# Patient Record
Sex: Female | Born: 1980 | Race: Black or African American | Hispanic: No | Smoking: Former smoker
Health system: Southern US, Community
[De-identification: ages and names within clinical notes are randomized; demographics above are authoritative.]

## PROBLEM LIST (undated history)

## (undated) ENCOUNTER — Inpatient Hospital Stay (HOSPITAL_COMMUNITY): Payer: Self-pay

## (undated) DIAGNOSIS — K219 Gastro-esophageal reflux disease without esophagitis: Secondary | ICD-10-CM

## (undated) DIAGNOSIS — F32A Depression, unspecified: Secondary | ICD-10-CM

## (undated) DIAGNOSIS — O24419 Gestational diabetes mellitus in pregnancy, unspecified control: Secondary | ICD-10-CM

## (undated) DIAGNOSIS — F329 Major depressive disorder, single episode, unspecified: Secondary | ICD-10-CM

## (undated) DIAGNOSIS — E119 Type 2 diabetes mellitus without complications: Secondary | ICD-10-CM

## (undated) DIAGNOSIS — F419 Anxiety disorder, unspecified: Secondary | ICD-10-CM

## (undated) HISTORY — PX: CHOLECYSTECTOMY: SHX55

## (undated) HISTORY — PX: TONSILLECTOMY: SUR1361

## (undated) HISTORY — PX: WISDOM TOOTH EXTRACTION: SHX21

## (undated) HISTORY — PX: DILATION AND CURETTAGE OF UTERUS: SHX78

## (undated) HISTORY — DX: Type 2 diabetes mellitus without complications: E11.9

---
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
## 1999-02-13 ENCOUNTER — Encounter: Admission: RE | Admit: 1999-02-13 | Discharge: 1999-02-13 | Payer: Self-pay | Admitting: Sports Medicine

## 1999-02-23 ENCOUNTER — Emergency Department (HOSPITAL_COMMUNITY): Admission: EM | Admit: 1999-02-23 | Discharge: 1999-02-23 | Payer: Self-pay | Admitting: Emergency Medicine

## 1999-09-28 ENCOUNTER — Emergency Department (HOSPITAL_COMMUNITY): Admission: EM | Admit: 1999-09-28 | Discharge: 1999-09-28 | Payer: Self-pay | Admitting: Emergency Medicine

## 2001-04-20 ENCOUNTER — Ambulatory Visit: Admit: 2001-04-20 | Disposition: A | Discharge status: Home / Self Care | Payer: Self-pay | Source: Ambulatory Visit

## 2001-10-09 ENCOUNTER — Emergency Department: Admit: 2001-10-09 | Payer: Self-pay | Source: Emergency Department | Admitting: Emergency Medicine

## 2003-10-25 ENCOUNTER — Emergency Department: Admit: 2003-10-25 | Payer: Self-pay | Source: Emergency Department | Admitting: Emergency Medical Services

## 2003-10-27 ENCOUNTER — Emergency Department: Admit: 2003-10-27 | Payer: Self-pay | Source: Emergency Department | Admitting: Emergency Medicine

## 2005-06-23 ENCOUNTER — Other Ambulatory Visit: Admission: RE | Admit: 2005-06-23 | Discharge: 2005-06-23 | Payer: Self-pay | Admitting: Obstetrics and Gynecology

## 2005-10-06 ENCOUNTER — Emergency Department (HOSPITAL_COMMUNITY): Admission: EM | Admit: 2005-10-06 | Discharge: 2005-10-06 | Payer: Self-pay | Admitting: Family Medicine

## 2005-10-18 ENCOUNTER — Inpatient Hospital Stay (HOSPITAL_COMMUNITY): Admission: AD | Admit: 2005-10-18 | Discharge: 2005-10-18 | Payer: Self-pay | Admitting: Obstetrics and Gynecology

## 2006-01-11 ENCOUNTER — Inpatient Hospital Stay (HOSPITAL_COMMUNITY): Admission: AD | Admit: 2006-01-11 | Discharge: 2006-01-14 | Payer: Self-pay | Admitting: Obstetrics and Gynecology

## 2006-01-15 ENCOUNTER — Encounter: Admission: RE | Admit: 2006-01-15 | Discharge: 2006-01-20 | Payer: Self-pay | Admitting: Obstetrics and Gynecology

## 2006-07-18 ENCOUNTER — Emergency Department (HOSPITAL_COMMUNITY): Admission: EM | Admit: 2006-07-18 | Discharge: 2006-07-18 | Payer: Self-pay | Admitting: Emergency Medicine

## 2006-09-19 ENCOUNTER — Emergency Department (HOSPITAL_COMMUNITY): Admission: EM | Admit: 2006-09-19 | Discharge: 2006-09-19 | Payer: Self-pay | Admitting: Emergency Medicine

## 2006-12-03 ENCOUNTER — Emergency Department (HOSPITAL_COMMUNITY): Admission: EM | Admit: 2006-12-03 | Discharge: 2006-12-03 | Payer: Self-pay | Admitting: Emergency Medicine

## 2006-12-08 ENCOUNTER — Encounter: Admission: RE | Admit: 2006-12-08 | Discharge: 2006-12-08 | Payer: Self-pay | Admitting: Internal Medicine

## 2007-01-17 ENCOUNTER — Ambulatory Visit (HOSPITAL_COMMUNITY): Admission: RE | Admit: 2007-01-17 | Discharge: 2007-01-17 | Payer: Self-pay | Admitting: Surgery

## 2007-01-17 ENCOUNTER — Encounter (INDEPENDENT_AMBULATORY_CARE_PROVIDER_SITE_OTHER): Payer: Self-pay | Admitting: Surgery

## 2007-07-03 ENCOUNTER — Emergency Department (HOSPITAL_COMMUNITY): Admission: EM | Admit: 2007-07-03 | Discharge: 2007-07-03 | Payer: Self-pay | Admitting: Emergency Medicine

## 2007-07-19 ENCOUNTER — Emergency Department (HOSPITAL_COMMUNITY): Admission: EM | Admit: 2007-07-19 | Discharge: 2007-07-19 | Payer: Self-pay | Admitting: Emergency Medicine

## 2008-04-15 ENCOUNTER — Emergency Department (HOSPITAL_COMMUNITY): Admission: EM | Admit: 2008-04-15 | Discharge: 2008-04-15 | Payer: Self-pay | Admitting: Emergency Medicine

## 2008-05-29 ENCOUNTER — Emergency Department (HOSPITAL_COMMUNITY): Admission: EM | Admit: 2008-05-29 | Discharge: 2008-05-29 | Payer: Self-pay | Admitting: *Deleted

## 2008-08-04 ENCOUNTER — Emergency Department (HOSPITAL_COMMUNITY): Admission: EM | Admit: 2008-08-04 | Discharge: 2008-08-04 | Payer: Self-pay | Admitting: Emergency Medicine

## 2008-09-21 IMAGING — CT CT ABDOMEN W/O CM
2 of 4 series · 17 of 46 positions shown, 19 images · IV contrast (agent unspecified)
Comparison: None.

CLINICAL DATA: Left flank pain and nausea.
 ABDOMEN CT WITHOUT CONTRAST:
TECHNIQUE: Multidetector CT imaging of the abdomen was performed following the standard protocol without IV contrast.
TECHNIQUE: Multidetector CT imaging of the pelvis was performed following the standard protocol without IV contrast.

[Series 2: greater than (id) · axial · 0.94mm/px · z∈[-536,-86]mm · 14 of 100 slices shown, 16 images]
[im 5/100  soft-tissue]
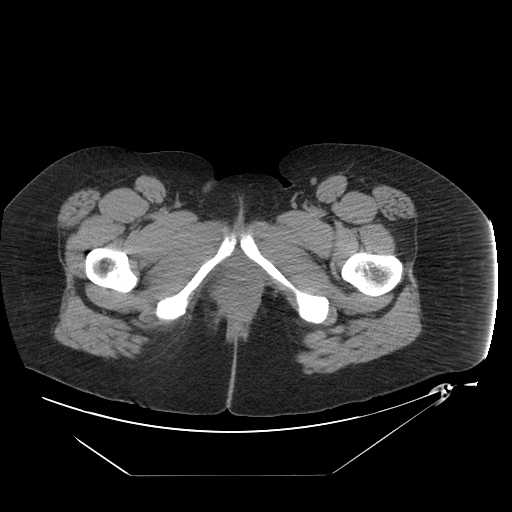
[im 5/100  bone]
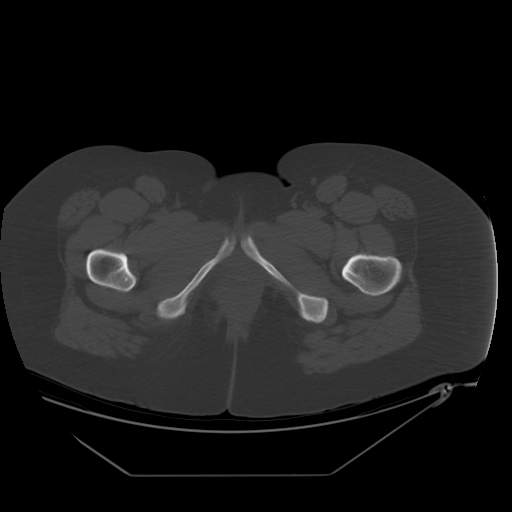
[im 13/100  soft-tissue]
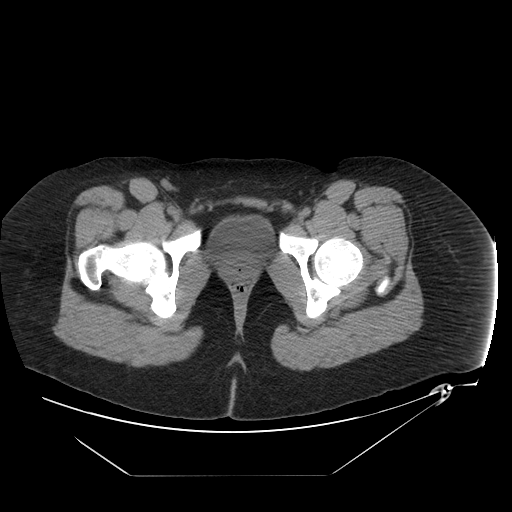
[im 21/100  soft-tissue]
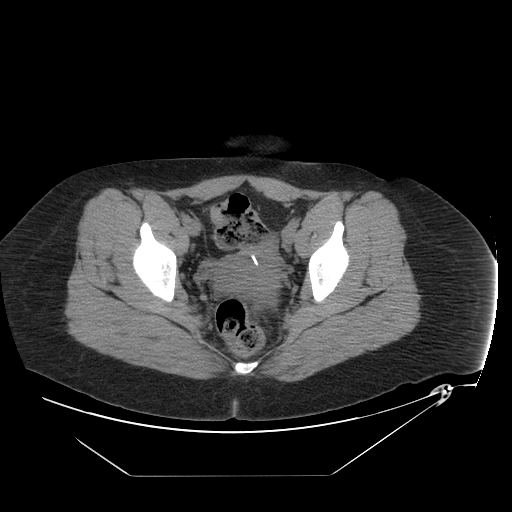
[im 25/100  soft-tissue]
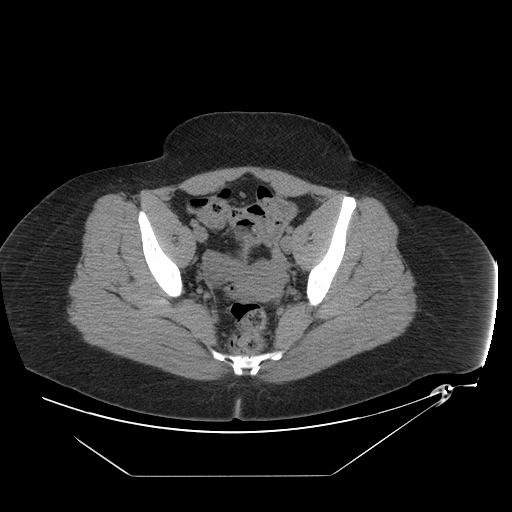
[im 34/100  soft-tissue]
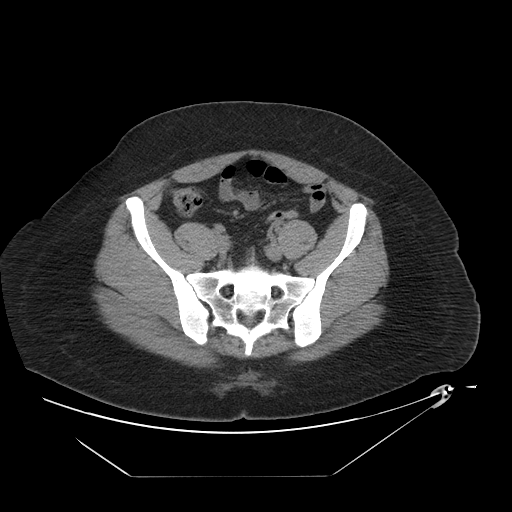
[im 42/100  soft-tissue]
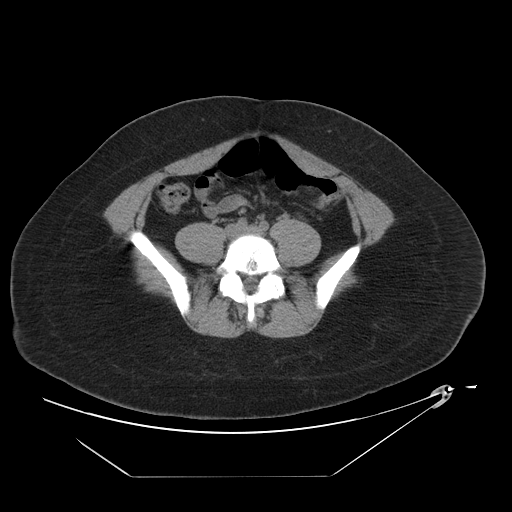
[im 46/100  soft-tissue]
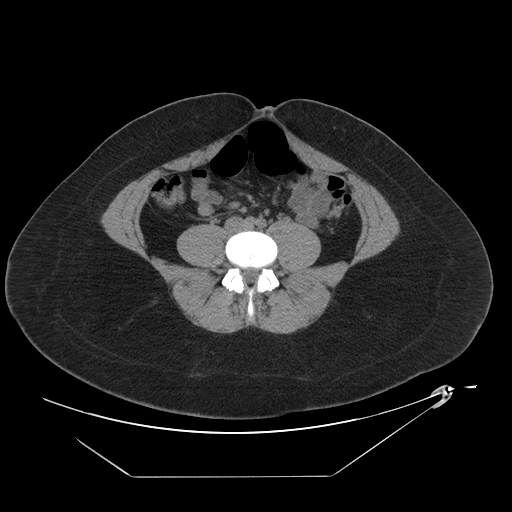
[im 54/100  soft-tissue]
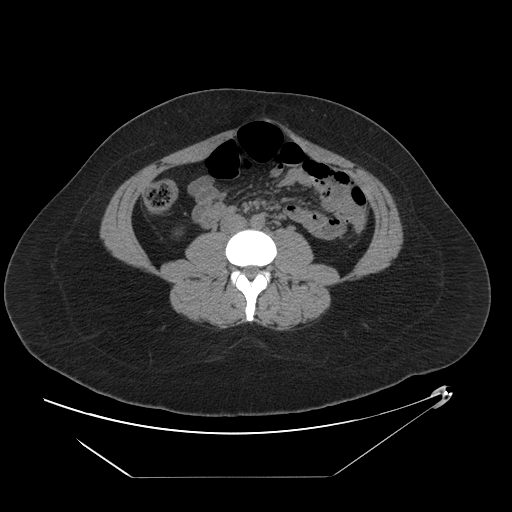
[im 58/100  soft-tissue]
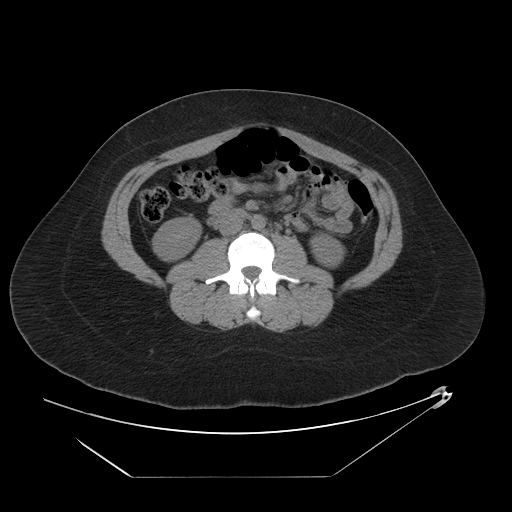
[im 58/100  bone]
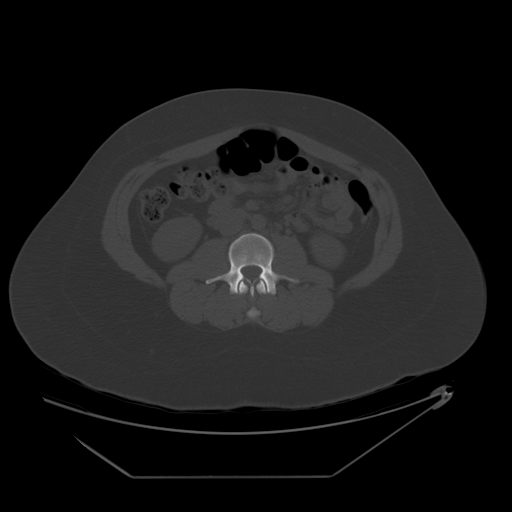
[im 67/100  soft-tissue]
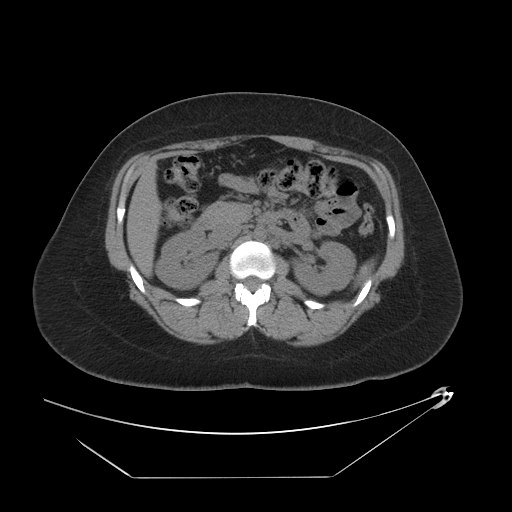
[im 75/100  soft-tissue]
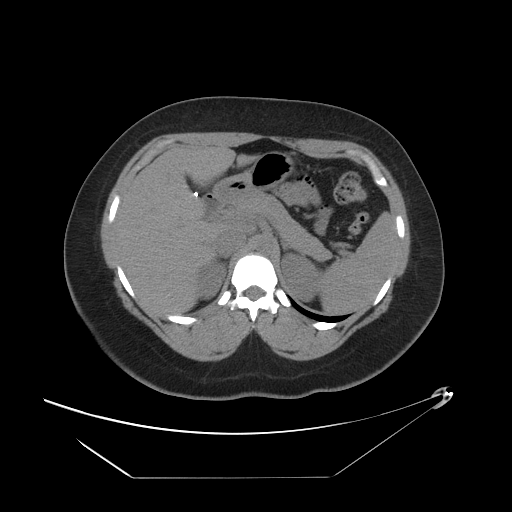
[im 79/100  soft-tissue]
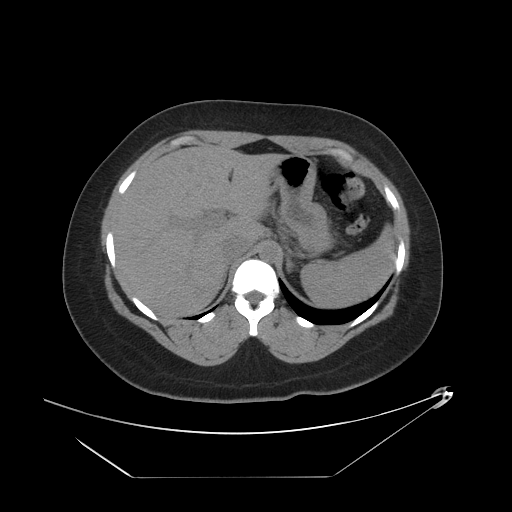
[im 87/100  soft-tissue]
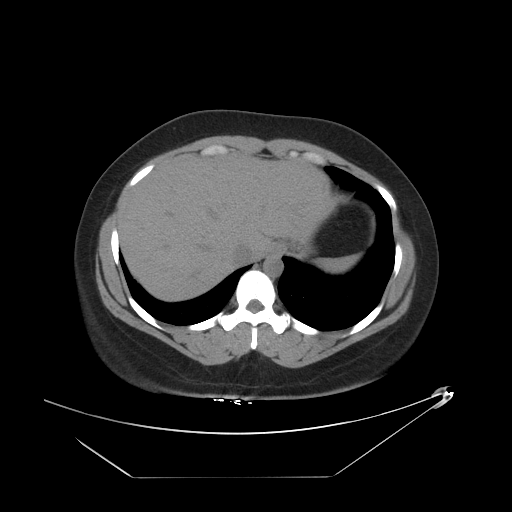
[im 95/100  soft-tissue]
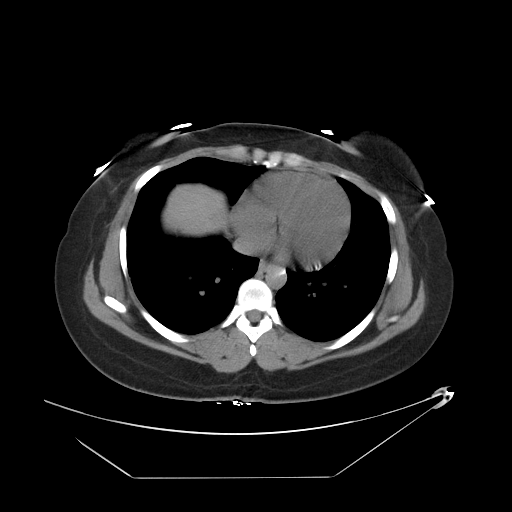

[Series 401: reformatted · coronal · 0.99mm/px · 3 of 134 slices shown]
[im 45/134  soft-tissue]
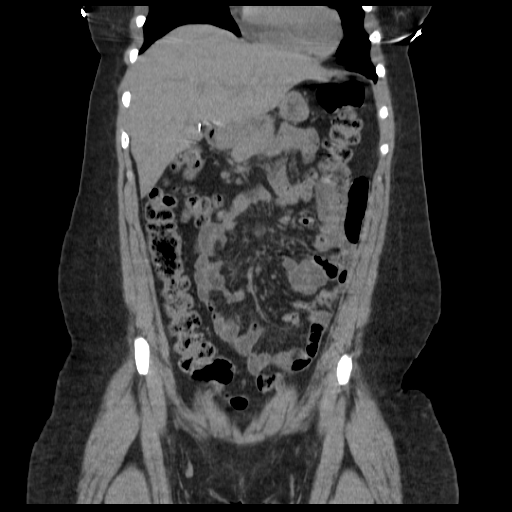
[im 60/134  soft-tissue]
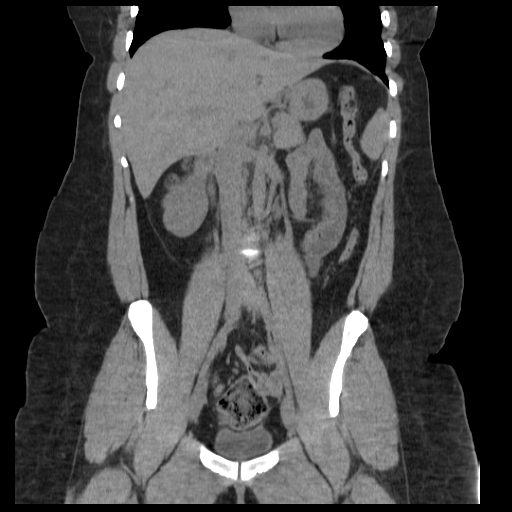
[im 74/134  soft-tissue]
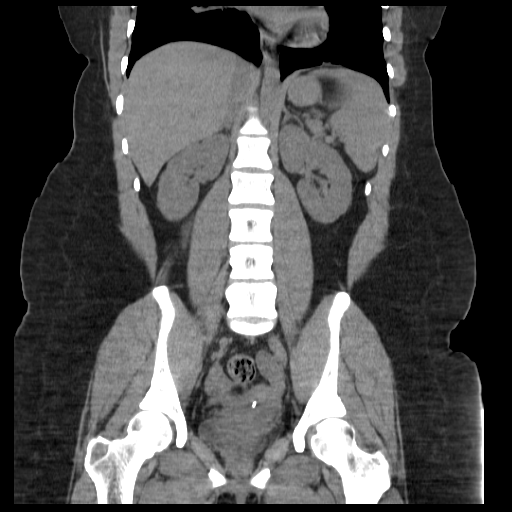

[17 of 46 positions shown; findings below may reference images not displayed]

FINDINGS: Right base pulmonary nodule measures 4 mm.
 Subpleural nodule at the left base measures 5.5 mm. 
 There is no pleural fluid or pulmonary edema. 
 The liver is normal in attenuation and morphology.  
 The patient is status post cholecystectomy. 
 The spleen is negative.  
 Both adrenal glands are negative.
 The right kidney is negative.  
 The left kidney is negative.  
 No evidence for nephrolithiasis or hydronephrosis is noted.
 No evidence for proximal hydroureter.
 Negative for enlarged retroperitoneal or small bowel mesenteric lymph nodes.  
 There is no free fluid or abnormal fluid collections.  
 Bowel loops of the upper abdomen are unremarkable.
 The patient is status post cholecystectomy. 
 Review of the bone windows is grossly unremarkable.
IMPRESSION: 1.  Negative for renal stone or obstructive uropathy.
 2.  Status post cholecystectomy.  
 PELVIS CT WITHOUT CONTRAST:
FINDINGS: There is no evidence for distal ureteral urolithiasis or hydroureter.  Urinary bladder is negative.  
 There is an IUD within the uterine cavity.
 The adnexal structures appear unremarkable.  There is a small amount of free fluid in the dependent portion of the pelvis.  
 There are no enlarged pelvic or inguinal lymph nodes. 
 Review of bone windows is unremarkable.
IMPRESSION: 1.  Negative for bladder stone or distal ureterolithiasis.
 2.  IUD in uterine cavity.
 3.  Trace free pelvic fluid.

## 2008-10-16 ENCOUNTER — Emergency Department (HOSPITAL_COMMUNITY): Admission: EM | Admit: 2008-10-16 | Discharge: 2008-10-16 | Payer: Self-pay | Admitting: Family Medicine

## 2009-02-11 ENCOUNTER — Emergency Department (HOSPITAL_COMMUNITY): Admission: EM | Admit: 2009-02-11 | Discharge: 2009-02-11 | Payer: Self-pay | Admitting: Family Medicine

## 2009-07-07 ENCOUNTER — Emergency Department (HOSPITAL_COMMUNITY): Admission: EM | Admit: 2009-07-07 | Discharge: 2009-07-07 | Payer: Self-pay | Admitting: Family Medicine

## 2009-09-11 ENCOUNTER — Emergency Department (HOSPITAL_COMMUNITY): Admission: EM | Admit: 2009-09-11 | Discharge: 2009-09-11 | Payer: Self-pay | Admitting: Emergency Medicine

## 2009-10-03 ENCOUNTER — Encounter (INDEPENDENT_AMBULATORY_CARE_PROVIDER_SITE_OTHER): Payer: Self-pay | Admitting: Cardiovascular Disease

## 2009-10-03 ENCOUNTER — Ambulatory Visit (HOSPITAL_COMMUNITY): Admission: RE | Admit: 2009-10-03 | Discharge: 2009-10-03 | Payer: Self-pay | Admitting: Cardiovascular Disease

## 2010-07-27 ENCOUNTER — Inpatient Hospital Stay (INDEPENDENT_AMBULATORY_CARE_PROVIDER_SITE_OTHER)
Admission: RE | Admit: 2010-07-27 | Discharge: 2010-07-27 | Disposition: A | Discharge status: Home / Self Care | Payer: 59 | Source: Ambulatory Visit | Attending: Emergency Medicine | Admitting: Emergency Medicine

## 2010-07-27 DIAGNOSIS — J019 Acute sinusitis, unspecified: Secondary | ICD-10-CM

## 2010-08-20 LAB — CBC
HCT: 34.2 % — ABNORMAL LOW (ref 36.0–46.0)
Hemoglobin: 11.8 g/dL — ABNORMAL LOW (ref 12.0–15.0)
MCHC: 34.5 g/dL (ref 30.0–36.0)
MCV: 87.7 fL (ref 78.0–100.0)
Platelets: 306 10*3/uL (ref 150–400)
RBC: 3.91 MIL/uL (ref 3.87–5.11)
RDW: 13.1 % (ref 11.5–15.5)
WBC: 8.2 10*3/uL (ref 4.0–10.5)

## 2010-08-20 LAB — COMPREHENSIVE METABOLIC PANEL
AST: 20 U/L (ref 0–37)
Albumin: 3.4 g/dL — ABNORMAL LOW (ref 3.5–5.2)
Alkaline Phosphatase: 45 U/L (ref 39–117)
BUN: 7 mg/dL (ref 6–23)
Chloride: 106 mEq/L (ref 96–112)
Creatinine, Ser: 0.76 mg/dL (ref 0.4–1.2)
Glucose, Bld: 118 mg/dL — ABNORMAL HIGH (ref 70–99)
Total Bilirubin: 0.4 mg/dL (ref 0.3–1.2)

## 2010-08-20 LAB — DIFFERENTIAL
Eosinophils Absolute: 0.1 10*3/uL (ref 0.0–0.7)
Eosinophils Relative: 1 % (ref 0–5)
Lymphs Abs: 2.9 10*3/uL (ref 0.7–4.0)
Monocytes Absolute: 0.7 10*3/uL (ref 0.1–1.0)
Neutro Abs: 4.4 10*3/uL (ref 1.7–7.7)
Neutrophils Relative %: 53 % (ref 43–77)

## 2010-08-20 LAB — PROTIME-INR: Prothrombin Time: 12.9 seconds (ref 11.6–15.2)

## 2010-08-20 LAB — CK TOTAL AND CKMB (NOT AT ARMC)
CK, MB: 1 ng/mL (ref 0.3–4.0)
Relative Index: 0.8 (ref 0.0–2.5)

## 2010-08-20 LAB — POCT CARDIAC MARKERS
CKMB, poc: 1.3 ng/mL (ref 1.0–8.0)
CKMB, poc: <1 ng/mL — ABNORMAL LOW (ref 1.0–8.0)
Myoglobin, poc: 62.7 ng/mL (ref 12–200)
Troponin i, poc: <0.05 ng/mL (ref 0.00–0.09)

## 2010-08-20 LAB — POCT I-STAT, CHEM 8
BUN: 8 mg/dL (ref 6–23)
Calcium, Ion: 1.21 mmol/L (ref 1.12–1.32)
Chloride: 104 mEq/L (ref 96–112)
Creatinine, Ser: 0.7 mg/dL (ref 0.4–1.2)
Glucose, Bld: 151 mg/dL — ABNORMAL HIGH (ref 70–99)
HCT: 36 % (ref 36.0–46.0)
Hemoglobin: 12.2 g/dL (ref 12.0–15.0)
Potassium: 3.8 mEq/L (ref 3.5–5.1)
Sodium: 141 mEq/L (ref 135–145)
TCO2: 26 mmol/L (ref 0–100)

## 2010-08-20 LAB — HEMOGLOBIN A1C
Hgb A1c MFr Bld: 6.2 % — ABNORMAL HIGH (ref <5.7)
Mean Plasma Glucose: 131 mg/dL — ABNORMAL HIGH (ref <117)

## 2010-08-20 LAB — D-DIMER, QUANTITATIVE: D-Dimer, Quant: 0.47 ug/mL-FEU (ref 0.00–0.48)

## 2010-09-05 LAB — POCT PREGNANCY, URINE: Preg Test, Ur: NEGATIVE

## 2010-09-07 ENCOUNTER — Inpatient Hospital Stay (INDEPENDENT_AMBULATORY_CARE_PROVIDER_SITE_OTHER)
Admission: RE | Admit: 2010-09-07 | Discharge: 2010-09-07 | Disposition: A | Discharge status: Home / Self Care | Payer: 59 | Source: Ambulatory Visit | Attending: Emergency Medicine | Admitting: Emergency Medicine

## 2010-09-07 DIAGNOSIS — J309 Allergic rhinitis, unspecified: Secondary | ICD-10-CM

## 2010-09-07 DIAGNOSIS — J029 Acute pharyngitis, unspecified: Secondary | ICD-10-CM

## 2010-09-07 LAB — POCT RAPID STREP A (OFFICE): Streptococcus, Group A Screen (Direct): NEGATIVE

## 2010-10-14 NOTE — Op Note (Signed)
Rachael Chapman, Rachael Chapman            ACCOUNT NO.:  [id number]   MEDICAL RECORD NO.:  [id number]          PATIENT TYPE:  AMB   LOCATION:  SDS                          FACILITY:  [facility]   PHYSICIAN:  Thornton Park. Daphine Deutscher, MD  DATE OF BIRTH:  Mar 08, 1981   DATE OF PROCEDURE:  01/17/2007  DATE OF DISCHARGE:                               OPERATIVE REPORT   PREOPERATIVE DIAGNOSIS:  Chronic cholecystitis.   POSTOPERATIVE DIAGNOSIS:  Chronic cholecystitis with solitary this stone  and normal intraoperative cholangiogram.   PROCEDURE:  Laparoscopic cholecystectomy.   SURGEON:  Luretha Murphy, M.D.   ASSISTANT:  Harriette Bouillon, M.D.   ANESTHESIA:  General endotracheal.   DESCRIPTION OF PROCEDURE:  The patient was taken to room 17 on January 17, 2007 and given general anesthesia.  Latex precautions were used.  Abdomen was prepped with Techni-Care and draped sterilely.  Abdomen was  entered with a longitudinal incision to the umbilicus.  The Hasson  cannula was inserted without difficulty.  The abdomen was insufflated  and exposure was gained to the upper abdomen.  Three trocars placed in  upper abdomen, the gallbladder was grasped, elevated.  Calot's triangle  was dissected free and the anatomy was identified including Calot's node  and the cystic duct and these were clipped and then a Reddick catheter  was introduced and a cholangiogram obtained using the C-arm which showed  good intrahepatic filling of right and left ducts and free flow into the  duodenum.  No stones.  The cystic duct was triple clipped, divided and  the cystic artery seemed to come out over on the right side and more  lateral and this was double clipped, divided and the gallbladder was  removed from the gallbladder bed with the hook electrocautery without  entering it.  The gallbladder bed was inspected.  No bleeding or bile  leaks were noted.  The gallbladder was placed in a bag and brought out  through the umbilicus.   Under laparoscopic vision the umbilical defect  was repaired with 0 Vicryl figure-of-eight suture.  The gallbladder bed  was reinspected and trocar sites were inspected.  No other abnormalities  noted.  Abdomen was deflated.  Port sites had all been injected with  Marcaine, lidocaine and closed 4-0 Vicryl, Benzoin and Steri-Strips.  The patient tolerated procedure well and was taken to recovery room in  satisfactory condition.      Thornton Park Daphine Deutscher, MD  Electronically Signed     MBM/MEDQ  D:  01/17/2007  T:  01/17/2007  Job:  [id number]   cc:   Olene Craven, M.D.

## 2011-01-04 ENCOUNTER — Inpatient Hospital Stay (INDEPENDENT_AMBULATORY_CARE_PROVIDER_SITE_OTHER)
Admission: RE | Admit: 2011-01-04 | Discharge: 2011-01-04 | Disposition: A | Discharge status: Home / Self Care | Payer: 59 | Source: Ambulatory Visit | Attending: Family Medicine | Admitting: Family Medicine

## 2011-01-04 DIAGNOSIS — J019 Acute sinusitis, unspecified: Secondary | ICD-10-CM

## 2011-02-20 LAB — DIFFERENTIAL
Eosinophils Relative: 2
Lymphocytes Relative: 43
Lymphs Abs: 3.1
Monocytes Absolute: 0.6
Monocytes Relative: 9
Neutro Abs: 3.3

## 2011-02-20 LAB — URINE MICROSCOPIC-ADD ON

## 2011-02-20 LAB — I-STAT 8, (EC8 V) (CONVERTED LAB)
Acid-Base Excess: 3 — ABNORMAL HIGH
BUN: 4 — ABNORMAL LOW
Chloride: 105
Glucose, Bld: 85
Potassium: 4.3
pCO2, Ven: 45.4
pH, Ven: 7.399 — ABNORMAL HIGH

## 2011-02-20 LAB — CBC
HCT: 38.8
Hemoglobin: 12.9
RBC: 4.31
RDW: 12.3

## 2011-02-20 LAB — POCT I-STAT CREATININE
Creatinine, Ser: 1
Operator id: 285491

## 2011-02-20 LAB — URINALYSIS, ROUTINE W REFLEX MICROSCOPIC
Glucose, UA: NEGATIVE
Ketones, ur: NEGATIVE
Leukocytes, UA: NEGATIVE
Protein, ur: NEGATIVE
Specific Gravity, Urine: 1.005

## 2011-03-03 ENCOUNTER — Inpatient Hospital Stay (HOSPITAL_COMMUNITY)
Admission: AD | Admit: 2011-03-03 | Discharge: 2011-03-03 | Disposition: A | Discharge status: Home / Self Care | Payer: 59 | Source: Ambulatory Visit | Attending: Obstetrics and Gynecology | Admitting: Obstetrics and Gynecology

## 2011-03-03 ENCOUNTER — Encounter (HOSPITAL_COMMUNITY): Payer: Self-pay

## 2011-03-03 DIAGNOSIS — R1115 Cyclical vomiting syndrome unrelated to migraine: Secondary | ICD-10-CM

## 2011-03-03 DIAGNOSIS — R1110 Vomiting, unspecified: Secondary | ICD-10-CM

## 2011-03-03 DIAGNOSIS — O210 Mild hyperemesis gravidarum: Secondary | ICD-10-CM | POA: Insufficient documentation

## 2011-03-03 LAB — URINALYSIS, ROUTINE W REFLEX MICROSCOPIC
Nitrite: NEGATIVE
Protein, ur: NEGATIVE mg/dL
Specific Gravity, Urine: 1.01 (ref 1.005–1.030)
Urobilinogen, UA: 0.2 mg/dL (ref 0.0–1.0)

## 2011-03-03 LAB — URINE MICROSCOPIC-ADD ON

## 2011-03-03 MED ORDER — PROMETHAZINE HCL 25 MG PO TABS: 25.0000 mg | ORAL_TABLET | Freq: Four times a day (QID) | ORAL | Status: DC | PRN

## 2011-03-03 NOTE — Progress Notes (Signed)
Pt state she has had nausea and vomiting with the pregnancy and has been controlled with Zofran in the morning. States today is worse. Has a slight headache.

## 2011-03-03 NOTE — ED Notes (Signed)
States takes Zofran every morning and has been doing ok, but this morning was worse and she vomited X 1. States she feels better now, but is afraid she is going to feel worse again. States she has a slight HA and just does not feel well.

## 2011-03-03 NOTE — ED Provider Notes (Signed)
History   Pt presents today c/o N&V x 1 this am. She states she normally takes zofran but today it didn't seem to help. When she got to work she vomited and she became concerned, so she presented to the MAU. She denies abd pain, vag dc, bleeding, or any other problems at this time.  Chief Complaint  Patient presents with  . Emesis   HPI  OB History    Grav Para Term Preterm Abortions TAB SAB Ect Mult Living   3 1 1  0 1 0 0 1 0 1      Past Medical History  Diagnosis Date  . Asthma     Past Surgical History  Procedure Date  . Tonsillectomy   . Cholecystectomy   . Dilation and curettage of uterus   . Wisdom tooth extraction     No family history on file.  History  Substance Use Topics  . Smoking status: Former Games developer  . Smokeless tobacco: Never Used  . Alcohol Use: No    Allergies: Allergies not on file  No prescriptions prior to admission    Review of Systems  Constitutional: Negative for fever.  Cardiovascular: Negative for chest pain and palpitations.  Gastrointestinal: Positive for nausea and vomiting. Negative for abdominal pain, diarrhea and constipation.  Genitourinary: Negative for dysuria, urgency, frequency and hematuria.  Neurological: Negative for dizziness.  Psychiatric/Behavioral: Negative for depression and suicidal ideas.   Physical Exam   Blood pressure 126/80, pulse 85, temperature 98.9 F (37.2 C), temperature source Oral, resp. rate 20, height 5\' 9"  (1.753 m), weight 250 lb 9.6 oz (113.671 kg), last menstrual period 12/24/2010, SpO2 100.00%.  Physical Exam  Constitutional: She is oriented to person, place, and time. She appears well-developed and well-nourished. No distress.  HENT:  Head: Normocephalic and atraumatic.  Eyes: EOM are normal. Pupils are equal, round, and reactive to light.  Cardiovascular: Normal rate, regular rhythm and normal heart sounds.  Exam reveals no gallop and no friction rub.   No murmur heard. Respiratory: Effort  normal and breath sounds normal. No respiratory distress. She has no wheezes. She has no rales. She exhibits no tenderness.  GI: Soft. She exhibits no distension. There is no tenderness. There is no rebound and no guarding.  Neurological: She is alert and oriented to person, place, and time.  Skin: Skin is warm and dry. She is not diaphoretic.  Psychiatric: She has a normal mood and affect. Her behavior is normal. Judgment and thought content normal.    MAU Course  Procedures  Results for orders placed during the hospital encounter of 03/03/11 (from the past 24 hour(s))  URINALYSIS, ROUTINE W REFLEX MICROSCOPIC     Status: Abnormal   Collection Time   03/03/11  1:17 PM      Component Value Range   Color, Urine YELLOW  YELLOW    Appearance CLEAR  CLEAR    Specific Gravity, Urine 1.010  1.005 - 1.030    pH 6.5  5.0 - 8.0    Glucose, UA NEGATIVE  NEGATIVE (mg/dL)   Hgb urine dipstick TRACE (*) NEGATIVE    Bilirubin Urine NEGATIVE  NEGATIVE    Ketones, ur NEGATIVE  NEGATIVE (mg/dL)   Protein, ur NEGATIVE  NEGATIVE (mg/dL)   Urobilinogen, UA 0.2  0.0 - 1.0 (mg/dL)   Nitrite NEGATIVE  NEGATIVE    Leukocytes, UA TRACE (*) NEGATIVE   URINE MICROSCOPIC-ADD ON     Status: Abnormal   Collection Time   03/03/11  1:17 PM      Component Value Range   Squamous Epithelial / LPF FEW (*) RARE    WBC, UA 0-2  <3 (WBC/hpf)   RBC / HPF 0-2  <3 (RBC/hpf)    Discussed pt with Dr. Renaldo Fiddler. Assessment and Plan  N&V: discussed with pt at length. Will give Rx for phenergan. She has f/u scheduled. Discussed diet, activity, risks, and precautions.  Clinton Gallant. Rice III, DrHSc, MPAS, PA-C  03/03/2011, 1:35 PM   Henrietta Hoover, PA 03/03/11 1341

## 2011-03-06 LAB — OB RESULTS CONSOLE ABO/RH: RH Type: POSITIVE

## 2011-03-06 LAB — OB RESULTS CONSOLE HIV ANTIBODY (ROUTINE TESTING): HIV: NONREACTIVE

## 2011-03-16 LAB — CBC
HCT: 37.7
Hemoglobin: 12.8
MCHC: 34
Platelets: 393
RDW: 12.9

## 2011-03-16 LAB — BASIC METABOLIC PANEL
BUN: 5 — ABNORMAL LOW
CO2: 31
Calcium: 9.6
Glucose, Bld: 102 — ABNORMAL HIGH
Potassium: 3.8
Sodium: 141

## 2011-03-17 LAB — POCT PREGNANCY, URINE
Operator id: 28425
Preg Test, Ur: NEGATIVE

## 2011-03-17 LAB — URINALYSIS, ROUTINE W REFLEX MICROSCOPIC
Bilirubin Urine: NEGATIVE
Hgb urine dipstick: NEGATIVE
Nitrite: NEGATIVE
Protein, ur: NEGATIVE
Urobilinogen, UA: 1

## 2011-03-17 LAB — DIFFERENTIAL
Basophils Absolute: 0
Lymphocytes Relative: 7 — ABNORMAL LOW
Lymphs Abs: 0.5 — ABNORMAL LOW
Monocytes Absolute: 0.7
Monocytes Relative: 10
Neutro Abs: 6.4

## 2011-03-17 LAB — URINE CULTURE: Colony Count: >=100000

## 2011-03-17 LAB — POCT I-STAT CREATININE: Creatinine, Ser: 0.8

## 2011-03-17 LAB — CBC
Hemoglobin: 14
RBC: 4.73
WBC: 7.7

## 2011-03-17 LAB — I-STAT 8, (EC8 V) (CONVERTED LAB)
Acid-Base Excess: 2
Chloride: 104
pCO2, Ven: 31.9 — ABNORMAL LOW
pH, Ven: 7.496 — ABNORMAL HIGH

## 2011-06-27 ENCOUNTER — Encounter (HOSPITAL_COMMUNITY): Payer: Self-pay | Admitting: Advanced Practice Midwife

## 2011-06-27 ENCOUNTER — Inpatient Hospital Stay (HOSPITAL_COMMUNITY)
Admission: AD | Admit: 2011-06-27 | Discharge: 2011-06-27 | Disposition: A | Discharge status: Home / Self Care | Payer: 59 | Source: Ambulatory Visit | Attending: Obstetrics and Gynecology | Admitting: Obstetrics and Gynecology

## 2011-06-27 DIAGNOSIS — K529 Noninfective gastroenteritis and colitis, unspecified: Secondary | ICD-10-CM

## 2011-06-27 DIAGNOSIS — K5289 Other specified noninfective gastroenteritis and colitis: Secondary | ICD-10-CM

## 2011-06-27 DIAGNOSIS — O212 Late vomiting of pregnancy: Secondary | ICD-10-CM | POA: Insufficient documentation

## 2011-06-27 DIAGNOSIS — O368190 Decreased fetal movements, unspecified trimester, not applicable or unspecified: Secondary | ICD-10-CM | POA: Insufficient documentation

## 2011-06-27 DIAGNOSIS — R109 Unspecified abdominal pain: Secondary | ICD-10-CM | POA: Insufficient documentation

## 2011-06-27 LAB — URINE MICROSCOPIC-ADD ON

## 2011-06-27 LAB — URINALYSIS, ROUTINE W REFLEX MICROSCOPIC
Glucose, UA: NEGATIVE mg/dL
Ketones, ur: NEGATIVE mg/dL
pH: 6.5 (ref 5.0–8.0)

## 2011-06-27 LAB — COMPREHENSIVE METABOLIC PANEL
ALT: 8 U/L (ref 0–35)
AST: 13 U/L (ref 0–37)
Calcium: 9.2 mg/dL (ref 8.4–10.5)
Sodium: 136 mEq/L (ref 135–145)
Total Protein: 6.2 g/dL (ref 6.0–8.3)

## 2011-06-27 LAB — CBC
MCH: 28.8 pg (ref 26.0–34.0)
MCHC: 32.6 g/dL (ref 30.0–36.0)
Platelets: 332 10*3/uL (ref 150–400)
RDW: 12.8 % (ref 11.5–15.5)

## 2011-06-27 MED ORDER — LACTATED RINGERS IV SOLN: Freq: Once | INTRAVENOUS | Status: DC

## 2011-06-27 MED ORDER — LACTATED RINGERS IV SOLN
Freq: Once | INTRAVENOUS | Status: AC
  Administered 2011-06-27: 13:00:00 via INTRAVENOUS

## 2011-06-27 MED ORDER — ONDANSETRON HCL 8 MG PO TABS: 8.0000 mg | ORAL_TABLET | Freq: Three times a day (TID) | ORAL | Status: AC | PRN

## 2011-06-27 MED ORDER — ONDANSETRON HCL 4 MG/2ML IJ SOLN
4.0000 mg | Freq: Once | INTRAMUSCULAR | Status: AC
  Administered 2011-06-27: 4 mg via INTRAVENOUS
  Filled 2011-06-27: qty 2

## 2011-06-27 NOTE — Progress Notes (Signed)
Onset of vomiting this morning with cramping and decreased fetal movement, [redacted]w[redacted]d

## 2011-06-27 NOTE — ED Provider Notes (Signed)
History     Chief Complaint  Patient presents with  . Emesis  . Abdominal Cramping  . Decreased Fetal Movement   HPI This is a 31 y.o. female at [redacted]w[redacted]d who presents with C/O nausea and vomiting for several hours. Also has intermittent abdominal cramping with loose stools. No fever. Some decrease in Fetal movement.    OB History    Grav Para Term Preterm Abortions TAB SAB Ect Mult Living   3 1 1  0 1 0 0 1 0 1      Past Medical History  Diagnosis Date  . Asthma     Past Surgical History  Procedure Date  . Tonsillectomy   . Cholecystectomy   . Dilation and curettage of uterus   . Wisdom tooth extraction     History reviewed. No pertinent family history.  History  Substance Use Topics  . Smoking status: Former Games developer  . Smokeless tobacco: Never Used  . Alcohol Use: No    Allergies:  Allergies  Allergen Reactions  . Codeine Nausea And Vomiting    Prescriptions prior to admission  Medication Sig Dispense Refill  . Prenatal Vit-Fe Fumarate-FA (PRENATAL MULTIVITAMIN) TABS Take 1 tablet by mouth daily.      . promethazine (PHENERGAN) 25 MG tablet Take 25 mg by mouth every 6 (six) hours as needed. Takes for nausea        ROS As above  Physical Exam   Blood pressure 108/62, pulse 88, temperature 97.4 F (36.3 C), temperature source Oral, resp. rate 16, height 5\' 9"  (1.753 m), weight 260 lb (117.935 kg), last menstrual period 12/24/2010.  Physical Exam  Constitutional: She is oriented to person, place, and time. She appears well-developed and well-nourished. No distress.  HENT:  Head: Normocephalic.  Cardiovascular: Normal rate, regular rhythm and normal heart sounds.   Respiratory: Effort normal and breath sounds normal. No respiratory distress. She has no wheezes. She has no rales.  GI: Soft. She exhibits no distension and no mass. There is tenderness (mild, generalized). There is no rebound and no guarding.  Genitourinary:       FHR reassuring, good  variability. No contractions.   Musculoskeletal: Normal range of motion.  Neurological: She is alert and oriented to person, place, and time.  Skin: Skin is warm and dry.  Psychiatric: She has a normal mood and affect.   Results for orders placed during the hospital encounter of 06/27/11 (from the past 24 hour(s))  URINALYSIS, ROUTINE W REFLEX MICROSCOPIC     Status: Abnormal   Collection Time   06/27/11 11:40 AM      Component Value Range   Color, Urine YELLOW  YELLOW    APPearance CLEAR  CLEAR    Specific Gravity, Urine 1.020  1.005 - 1.030    pH 6.5  5.0 - 8.0    Glucose, UA NEGATIVE  NEGATIVE (mg/dL)   Hgb urine dipstick TRACE (*) NEGATIVE    Bilirubin Urine NEGATIVE  NEGATIVE    Ketones, ur NEGATIVE  NEGATIVE (mg/dL)   Protein, ur NEGATIVE  NEGATIVE (mg/dL)   Urobilinogen, UA 0.2  0.0 - 1.0 (mg/dL)   Nitrite NEGATIVE  NEGATIVE    Leukocytes, UA SMALL (*) NEGATIVE   URINE MICROSCOPIC-ADD ON     Status: Abnormal   Collection Time   06/27/11 11:40 AM      Component Value Range   Squamous Epithelial / LPF RARE  RARE    WBC, UA 3-6  <3 (WBC/hpf)   RBC /  HPF 3-6  <3 (RBC/hpf)   Bacteria, UA FEW (*) RARE    Urine-Other FEW YEAST      MAU Course  Procedures  MDM Discussed with Dr Marcelle Overlie. Will give IV hydration and Zofran.  Assessment and Plan  A:  Suspected gastroenteritis P:  CBC, CMET  Rachael Chapman 06/27/2011, 12:06 PM  CBC    Component Value Date/Time   WBC 14.2* 06/27/2011 1230   RBC 3.86* 06/27/2011 1230   HGB 11.1* 06/27/2011 1230   HCT 34.0* 06/27/2011 1230   PLT 332 06/27/2011 1230   MCV 88.1 06/27/2011 1230   MCH 28.8 06/27/2011 1230   MCHC 32.6 06/27/2011 1230   RDW 12.8 06/27/2011 1230   LYMPHSABS 2.9 09/11/2009 1220   MONOABS 0.7 09/11/2009 1220   EOSABS 0.1 09/11/2009 1220   BASOSABS 0.2* 09/11/2009 1220   CMET normal.  Feels better after 2 liters of IVF.   Up to void.  WIll d/c home with Rx Zofran

## 2011-08-05 ENCOUNTER — Encounter: Payer: 59 | Attending: Obstetrics and Gynecology | Admitting: Dietician

## 2011-08-05 ENCOUNTER — Encounter: Payer: Self-pay | Admitting: Dietician

## 2011-08-05 DIAGNOSIS — Z713 Dietary counseling and surveillance: Secondary | ICD-10-CM | POA: Insufficient documentation

## 2011-08-05 DIAGNOSIS — E119 Type 2 diabetes mellitus without complications: Secondary | ICD-10-CM | POA: Insufficient documentation

## 2011-08-05 NOTE — Progress Notes (Signed)
  Patient was seen on 08/05/2011 for Gestational Diabetes self-management class at the [facility]. The following learning objectives were met by the patient during this course:   States the definition of Gestational Diabetes  States why dietary management is important in controlling blood glucose  Describes the effects each nutrient has on blood glucose levels  Demonstrates ability to create a balanced meal plan  Demonstrates carbohydrate counting   States when to check blood glucose levels  Demonstrates proper blood glucose monitoring techniques  States the effect of stress and exercise on blood glucose levels  States the importance of limiting caffeine and abstaining from alcohol and smoking  Blood glucose monitor given: Accu-Check SmartView Meter Kit Lot # [id number] Exp: 10/29/2012 Blood glucose reading: 134 mg (was eating a snack in class this morning prior to practicing testing)  Patient instructed to monitor glucose levels: FBS: 60 - <90 2 hour: <120  Patient received handouts:  Nutrition Diabetes and Pregnancy  Carbohydrate Counting List  Patient will be seen for follow-up as needed.

## 2011-08-24 ENCOUNTER — Inpatient Hospital Stay (HOSPITAL_COMMUNITY)
Admission: AD | Admit: 2011-08-24 | Discharge: 2011-08-25 | Disposition: A | Discharge status: Home / Self Care | Payer: No Typology Code available for payment source | Source: Ambulatory Visit | Attending: Obstetrics and Gynecology | Admitting: Obstetrics and Gynecology

## 2011-08-24 ENCOUNTER — Encounter (HOSPITAL_COMMUNITY): Payer: Self-pay | Admitting: *Deleted

## 2011-08-24 DIAGNOSIS — O479 False labor, unspecified: Secondary | ICD-10-CM

## 2011-08-24 DIAGNOSIS — R51 Headache: Secondary | ICD-10-CM | POA: Insufficient documentation

## 2011-08-24 DIAGNOSIS — R109 Unspecified abdominal pain: Secondary | ICD-10-CM | POA: Insufficient documentation

## 2011-08-24 DIAGNOSIS — O99891 Other specified diseases and conditions complicating pregnancy: Secondary | ICD-10-CM | POA: Insufficient documentation

## 2011-08-24 DIAGNOSIS — M549 Dorsalgia, unspecified: Secondary | ICD-10-CM | POA: Insufficient documentation

## 2011-08-24 HISTORY — DX: Major depressive disorder, single episode, unspecified: F32.9

## 2011-08-24 HISTORY — DX: Depression, unspecified: F32.A

## 2011-08-24 HISTORY — DX: Gestational diabetes mellitus in pregnancy, unspecified control: O24.419

## 2011-08-24 LAB — KLEIHAUER-BETKE STAIN: Fetal Cells %: 0 %

## 2011-08-24 LAB — URINALYSIS, ROUTINE W REFLEX MICROSCOPIC
Bilirubin Urine: NEGATIVE
Hgb urine dipstick: NEGATIVE
Specific Gravity, Urine: 1.01 (ref 1.005–1.030)
Urobilinogen, UA: 0.2 mg/dL (ref 0.0–1.0)

## 2011-08-24 MED ORDER — LACTATED RINGERS IV BOLUS (SEPSIS)
1000.0000 mL | Freq: Once | INTRAVENOUS | Status: AC
  Administered 2011-08-24: 1000 mL via INTRAVENOUS
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
## 2011-08-25 NOTE — Discharge Instructions (Signed)

## 2011-08-27 ENCOUNTER — Encounter (HOSPITAL_COMMUNITY): Payer: Self-pay | Admitting: Emergency Medicine

## 2011-08-27 ENCOUNTER — Emergency Department (INDEPENDENT_AMBULATORY_CARE_PROVIDER_SITE_OTHER)
Admission: EM | Admit: 2011-08-27 | Discharge: 2011-08-27 | Disposition: A | Discharge status: Home / Self Care | Payer: Self-pay | Source: Home / Self Care

## 2011-08-27 DIAGNOSIS — M549 Dorsalgia, unspecified: Secondary | ICD-10-CM

## 2011-08-27 DIAGNOSIS — Z331 Pregnant state, incidental: Secondary | ICD-10-CM

## 2011-08-27 MED ORDER — CYCLOBENZAPRINE HCL 5 MG PO TABS: 5.0000 mg | ORAL_TABLET | Freq: Three times a day (TID) | ORAL | Status: AC | PRN

## 2011-08-27 MED ORDER — HYDROCODONE-ACETAMINOPHEN 5-325 MG PO TABS: 1.0000 | ORAL_TABLET | Freq: Four times a day (QID) | ORAL | Status: AC | PRN
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
## 2011-08-31 NOTE — ED Provider Notes (Signed)
Medical screening examination/treatment/procedure(s) were performed by non-physician practitioner and as supervising physician I was immediately available for consultation/collaboration.  Anquanette Bahner M. MD   Moranda Billiot M Jordie Skalsky, MD 08/31/11 2145 

## 2011-09-17 ENCOUNTER — Inpatient Hospital Stay (HOSPITAL_COMMUNITY): Payer: 59

## 2011-09-17 ENCOUNTER — Encounter (HOSPITAL_COMMUNITY): Payer: Self-pay

## 2011-09-17 ENCOUNTER — Inpatient Hospital Stay (HOSPITAL_COMMUNITY)
Admission: AD | Admit: 2011-09-17 | Discharge: 2011-09-17 | Disposition: A | Discharge status: Home / Self Care | Payer: 59 | Source: Ambulatory Visit | Attending: Obstetrics and Gynecology | Admitting: Obstetrics and Gynecology

## 2011-09-17 DIAGNOSIS — O368190 Decreased fetal movements, unspecified trimester, not applicable or unspecified: Secondary | ICD-10-CM

## 2011-09-17 NOTE — MAU Note (Signed)
Pt sent from MD office for decreased fm, BPP 4/8, taking glyburide for gestational DM. Denies bleeding or lof. +FM in MAU room.

## 2011-09-17 NOTE — MAU Note (Signed)
Sent from office, decreased fetal movement.  Denies any other problems.  Dr Arelia Sneddon had called and said he was sending her.

## 2011-09-17 NOTE — MAU Provider Note (Signed)
History   Pt presents today for BPP. She was seen earlier in the office and had a non-reactive NST. She states she has felt more movement since arriving in the MAU.  CSN: [id number]  Arrival date and time: 09/17/11 1204   First Provider Initiated Contact with Patient 09/17/11 1249      Chief Complaint  Patient presents with  . Decreased Fetal Movement   HPI  OB History    Grav Para Term Preterm Abortions TAB SAB Ect Mult Living   3 1 1  0 1 0 0 1 0 1      Past Medical History  Diagnosis Date  . Asthma   . Gestational diabetes   . Depression   . Preterm labor     Past Surgical History  Procedure Date  . Tonsillectomy   . Cholecystectomy   . Dilation and curettage of uterus   . Wisdom tooth extraction     Family History  Problem Relation Age of Onset  . Diabetes Mother   . Diabetes Father   . Diabetes Maternal Grandmother   . Diabetes Maternal Grandfather   . Diabetes Paternal Grandmother   . Diabetes Paternal Grandfather   . Anesthesia problems Neg Hx     History  Substance Use Topics  . Smoking status: Former Smoker -- 0.2 packs/day    Quit date: 01/30/2011  . Smokeless tobacco: Never Used  . Alcohol Use: No    Allergies:  Allergies  Allergen Reactions  . Codeine Nausea And Vomiting    Prescriptions prior to admission  Medication Sig Dispense Refill  . acetaminophen (TYLENOL) 325 MG tablet Take 650 mg by mouth every 6 (six) hours as needed.      . diphenhydrAMINE (BENADRYL) 25 MG tablet Take 50 mg by mouth every 6 (six) hours as needed.      . glyBURIDE (DIABETA) 5 MG tablet Take 5 mg by mouth 2 (two) times daily with a meal.       . Prenatal Vit-Fe Fumarate-FA (PRENATAL MULTIVITAMIN) TABS Take 1 tablet by mouth at bedtime.       . sertraline (ZOLOFT) 50 MG tablet Take 50 mg by mouth daily.        Review of Systems  Constitutional: Negative for fever and chills.  Eyes: Negative for blurred vision and double vision.  Respiratory: Negative for  cough, hemoptysis, sputum production, shortness of breath and wheezing.   Cardiovascular: Negative for chest pain and palpitations.  Gastrointestinal: Positive for abdominal pain. Negative for nausea, vomiting, diarrhea and constipation.  Genitourinary: Negative for dysuria, urgency, frequency and hematuria.  Neurological: Negative for dizziness and headaches.  Psychiatric/Behavioral: Negative for depression and suicidal ideas.   Physical Exam   Height 5\' 8"  (1.727 m), weight 275 lb (124.739 kg), last menstrual period 12/24/2010.  Physical Exam  Nursing note and vitals reviewed. Constitutional: She is oriented to person, place, and time. She appears well-developed and well-nourished. No distress.  HENT:  Head: Normocephalic and atraumatic.  Eyes: EOM are normal. Pupils are equal, round, and reactive to light.  GI: Soft. She exhibits no mass. There is no tenderness. There is no rebound and no guarding.  Neurological: She is alert and oriented to person, place, and time.  Skin: Skin is warm and dry. She is not diaphoretic.  Psychiatric: She has a normal mood and affect. Her behavior is normal. Judgment and thought content normal.    MAU Course  Procedures  BPP 8/8.  NST reactive.  Discussed pt with  Dr. Arelia Sneddon. Ok to dc to home and have pt f/u in office in the am at 9am.  Assessment and Plan  Decreased fetal movement: discussed with pt at length. Discussed diet, activity, risks, and precautions. Reminded of [facility].  Clinton Gallant. Mcgwire Dasaro III, DrHSc, MPAS, PA-C  09/17/2011, 2:23 PM

## 2011-09-17 NOTE — Discharge Instructions (Signed)
Fetal Movement Counts Patient Name: __________________________________________________ Patient Due Date: ____________________ Kick counts is highly recommended in high risk pregnancies, but it is a good idea for every pregnant woman to do. Start counting fetal movements at 28 weeks of the pregnancy. Fetal movements increase after eating a full meal or eating or drinking something sweet (the blood sugar is higher). It is also important to drink plenty of fluids (well hydrated) before doing the count. Lie on your left side because it helps with the circulation or you can sit in a comfortable chair with your arms over your belly (abdomen) with no distractions around you. DOING THE COUNT  Try to do the count the same time of day each time you do it.   Mark the day and time, then see how long it takes for you to feel 10 movements (kicks, flutters, swishes, rolls). You should have at least 10 movements within 2 hours. You will most likely feel 10 movements in much less than 2 hours. If you do not, wait an hour and count again. After a couple of days you will see a pattern.   What you are looking for is a change in the pattern or not enough counts in 2 hours. Is it taking longer in time to reach 10 movements?  SEEK MEDICAL CARE IF:  You feel less than 10 counts in 2 hours. Tried twice.   No movement in one hour.   The pattern is changing or taking longer each day to reach 10 counts in 2 hours.   You feel the baby is not moving as it usually does.  Date: ____________ Movements: ____________ Start time: ____________ Finish time: ____________  Date: ____________ Movements: ____________ Start time: ____________ Finish time: ____________ Date: ____________ Movements: ____________ Start time: ____________ Finish time: ____________ Date: ____________ Movements: ____________ Start time: ____________ Finish time: ____________ Date: ____________ Movements: ____________ Start time: ____________ Finish time:  ____________ Date: ____________ Movements: ____________ Start time: ____________ Finish time: ____________ Date: ____________ Movements: ____________ Start time: ____________ Finish time: ____________ Date: ____________ Movements: ____________ Start time: ____________ Finish time: ____________  Date: ____________ Movements: ____________ Start time: ____________ Finish time: ____________ Date: ____________ Movements: ____________ Start time: ____________ Finish time: ____________ Date: ____________ Movements: ____________ Start time: ____________ Finish time: ____________ Date: ____________ Movements: ____________ Start time: ____________ Finish time: ____________ Date: ____________ Movements: ____________ Start time: ____________ Finish time: ____________ Date: ____________ Movements: ____________ Start time: ____________ Finish time: ____________ Date: ____________ Movements: ____________ Start time: ____________ Finish time: ____________  Date: ____________ Movements: ____________ Start time: ____________ Finish time: ____________ Date: ____________ Movements: ____________ Start time: ____________ Finish time: ____________ Date: ____________ Movements: ____________ Start time: ____________ Finish time: ____________ Date: ____________ Movements: ____________ Start time: ____________ Finish time: ____________ Date: ____________ Movements: ____________ Start time: ____________ Finish time: ____________ Date: ____________ Movements: ____________ Start time: ____________ Finish time: ____________ Date: ____________ Movements: ____________ Start time: ____________ Finish time: ____________  Date: ____________ Movements: ____________ Start time: ____________ Finish time: ____________ Date: ____________ Movements: ____________ Start time: ____________ Finish time: ____________ Date: ____________ Movements: ____________ Start time: ____________ Finish time: ____________ Date: ____________ Movements:  ____________ Start time: ____________ Finish time: ____________ Date: ____________ Movements: ____________ Start time: ____________ Finish time: ____________ Date: ____________ Movements: ____________ Start time: ____________ Finish time: ____________ Date: ____________ Movements: ____________ Start time: ____________ Finish time: ____________  Date: ____________ Movements: ____________ Start time: ____________ Finish time: ____________ Date: ____________ Movements: ____________ Start time: ____________ Finish time: ____________ Date: ____________ Movements: ____________ Start time:   ____________ Finish time: ____________ Date: ____________ Movements: ____________ Start time: ____________ Finish time: ____________ Date: ____________ Movements: ____________ Start time: ____________ Finish time: ____________ Date: ____________ Movements: ____________ Start time: ____________ Finish time: ____________ Date: ____________ Movements: ____________ Start time: ____________ Finish time: ____________  Date: ____________ Movements: ____________ Start time: ____________ Finish time: ____________ Date: ____________ Movements: ____________ Start time: ____________ Finish time: ____________ Date: ____________ Movements: ____________ Start time: ____________ Finish time: ____________ Date: ____________ Movements: ____________ Start time: ____________ Finish time: ____________ Date: ____________ Movements: ____________ Start time: ____________ Finish time: ____________ Date: ____________ Movements: ____________ Start time: ____________ Finish time: ____________ Date: ____________ Movements: ____________ Start time: ____________ Finish time: ____________  Date: ____________ Movements: ____________ Start time: ____________ Finish time: ____________ Date: ____________ Movements: ____________ Start time: ____________ Finish time: ____________ Date: ____________ Movements: ____________ Start time: ____________ Finish  time: ____________ Date: ____________ Movements: ____________ Start time: ____________ Finish time: ____________ Date: ____________ Movements: ____________ Start time: ____________ Finish time: ____________ Date: ____________ Movements: ____________ Start time: ____________ Finish time: ____________ Date: ____________ Movements: ____________ Start time: ____________ Finish time: ____________  Date: ____________ Movements: ____________ Start time: ____________ Finish time: ____________ Date: ____________ Movements: ____________ Start time: ____________ Finish time: ____________ Date: ____________ Movements: ____________ Start time: ____________ Finish time: ____________ Date: ____________ Movements: ____________ Start time: ____________ Finish time: ____________ Date: ____________ Movements: ____________ Start time: ____________ Finish time: ____________ Date: ____________ Movements: ____________ Start time: ____________ Finish time: ____________ Document Released: 06/17/2006 Document Revised: 05/07/2011 Document Reviewed: 12/18/2008 [employer] Patient Information 2012 [employer]. 

## 2011-09-18 ENCOUNTER — Encounter (HOSPITAL_COMMUNITY): Payer: Self-pay

## 2011-09-24 ENCOUNTER — Encounter (HOSPITAL_COMMUNITY)
Admission: RE | Admit: 2011-09-24 | Discharge: 2011-09-24 | Disposition: A | Discharge status: Home / Self Care | Payer: 59 | Source: Ambulatory Visit | Attending: Obstetrics and Gynecology | Admitting: Obstetrics and Gynecology

## 2011-09-24 ENCOUNTER — Encounter (HOSPITAL_COMMUNITY): Payer: Self-pay

## 2011-09-24 HISTORY — DX: Gastro-esophageal reflux disease without esophagitis: K21.9

## 2011-09-24 LAB — CBC
MCH: 26.5 pg (ref 26.0–34.0)
MCHC: 31.9 g/dL (ref 30.0–36.0)
MCV: 82.9 fL (ref 78.0–100.0)
Platelets: 316 10*3/uL (ref 150–400)
RDW: 13.3 % (ref 11.5–15.5)

## 2011-09-24 LAB — BASIC METABOLIC PANEL
CO2: 23 mEq/L (ref 19–32)
Calcium: 9.3 mg/dL (ref 8.4–10.5)
Creatinine, Ser: 0.7 mg/dL (ref 0.50–1.10)
Glucose, Bld: 98 mg/dL (ref 70–99)

## 2011-09-24 LAB — SURGICAL PCR SCREEN: Staphylococcus aureus: NEGATIVE

## 2011-09-24 NOTE — Patient Instructions (Addendum)
   Your procedure is scheduled on: Wednesday May 1st  Enter through the [employer] of [facility] at: 11:30am Pick up the phone at the desk and dial [phone] and inform us of your arrival.  Please call this number if you have any problems the morning of surgery: [phone]  Remember: Do not eat food after midnight: Tuesday Do not drink clear liquids after: 9am Wednesday Take these medicines the morning of surgery with a SIP OF WATER: zantac, zoloft, hold morning glyburide  Do not wear jewelry, make-up, or FINGER nail polish Do not wear lotions, powders, perfumes or deodorant. Do not shave 48 hours prior to surgery. Do not bring valuables to the hospital. Contacts, dentures or bridgework may not be worn into surgery.  Leave suitcase in the car. After Surgery it may be brought to your room. For patients being admitted to the hospital, checkout time is 11:00am the day of discharge.  .     Remember to use your hibiclens as instructed.Please shower with 1/2 bottle the evening before your surgery and the other 1/2 bottle the morning of surgery. Neck down avoiding private area.

## 2011-09-24 NOTE — H&P (Signed)
31 yo G3P1011 @ 39 weeks presents for c-section.  Pregnancy complicated by A2DM and LGA.  EFW at 37 wks was 4322gms.  Rec primary c-section given GDM and EFW > 4000gms  PMHx:  Asthma, depression PSHx:  D&C, cholecystectomy, tonsillectomy All:  Codeine Meds:  Glyburide, PNV OBHx:  SVD x 1,  Ectopic x 1  AF, VSS Gen - NAD Abd - gravid, NT Ext - NT Cvx - closed  A/P:  IUP at 39wks with GDM and LGA presents for primary c-section.  R/B/A of c-section vs TOL discussed with pt and informed consent obtained.

## 2011-09-27 ENCOUNTER — Encounter (HOSPITAL_COMMUNITY): Payer: Self-pay | Admitting: *Deleted

## 2011-09-27 ENCOUNTER — Inpatient Hospital Stay (HOSPITAL_COMMUNITY)
Admission: AD | Admit: 2011-09-27 | Discharge: 2011-09-30 | DRG: 766 | Disposition: A | Discharge status: Home / Self Care | Payer: 59 | Source: Ambulatory Visit | Attending: Obstetrics and Gynecology | Admitting: Obstetrics and Gynecology

## 2011-09-27 ENCOUNTER — Inpatient Hospital Stay (HOSPITAL_COMMUNITY): Payer: 59 | Admitting: Anesthesiology

## 2011-09-27 ENCOUNTER — Encounter (HOSPITAL_COMMUNITY): Payer: Self-pay | Admitting: Anesthesiology

## 2011-09-27 ENCOUNTER — Encounter (HOSPITAL_COMMUNITY)
Admission: AD | Disposition: A | Discharge status: Home / Self Care | Payer: Self-pay | Source: Ambulatory Visit | Attending: Obstetrics and Gynecology

## 2011-09-27 DIAGNOSIS — O3660X0 Maternal care for excessive fetal growth, unspecified trimester, not applicable or unspecified: Principal | ICD-10-CM | POA: Diagnosis present

## 2011-09-27 DIAGNOSIS — O99814 Abnormal glucose complicating childbirth: Secondary | ICD-10-CM | POA: Diagnosis present

## 2011-09-27 DIAGNOSIS — Z01812 Encounter for preprocedural laboratory examination: Secondary | ICD-10-CM

## 2011-09-27 LAB — CBC
HCT: 35.7 % — ABNORMAL LOW (ref 36.0–46.0)
MCHC: 31.7 g/dL (ref 30.0–36.0)
RDW: 13.5 % (ref 11.5–15.5)

## 2011-09-27 LAB — POCT FERN TEST: Fern Test: POSITIVE

## 2011-09-27 SURGERY — Surgical Case
Anesthesia: Spinal | Site: Abdomen | Wound class: Clean Contaminated

## 2011-09-27 MED ORDER — FAMOTIDINE IN NACL 20-0.9 MG/50ML-% IV SOLN
20.0000 mg | Freq: Once | INTRAVENOUS | Status: DC
  Filled 2011-09-27: qty 50

## 2011-09-27 MED ORDER — CITRIC ACID-SODIUM CITRATE 334-500 MG/5ML PO SOLN
ORAL | Status: AC
  Administered 2011-09-27: 30 mL
  Filled 2011-09-27: qty 15

## 2011-09-27 MED ORDER — PRENATAL MULTIVITAMIN CH
1.0000 | ORAL_TABLET | Freq: Every day | ORAL | Status: DC
  Administered 2011-09-27 – 2011-09-30 (×4): 1 via ORAL
  Filled 2011-09-27 (×4): qty 1

## 2011-09-27 MED ORDER — IBUPROFEN 600 MG PO TABS
600.0000 mg | ORAL_TABLET | Freq: Four times a day (QID) | ORAL | Status: DC
  Administered 2011-09-28 – 2011-09-30 (×10): 600 mg via ORAL
  Filled 2011-09-27 (×11): qty 1

## 2011-09-27 MED ORDER — BUPIVACAINE IN DEXTROSE 0.75-8.25 % IT SOLN
INTRATHECAL | Status: DC | PRN
  Administered 2011-09-27: 1.5 mL via INTRATHECAL

## 2011-09-27 MED ORDER — ONDANSETRON HCL 4 MG/2ML IJ SOLN
4.0000 mg | Freq: Three times a day (TID) | INTRAMUSCULAR | Status: DC | PRN
  Filled 2011-09-27: qty 2

## 2011-09-27 MED ORDER — PHENYLEPHRINE HCL 10 MG/ML IJ SOLN
INTRAMUSCULAR | Status: DC | PRN
  Administered 2011-09-27 (×6): 40 ug via INTRAVENOUS

## 2011-09-27 MED ORDER — METOCLOPRAMIDE HCL 5 MG/ML IJ SOLN: 10.0000 mg | Freq: Three times a day (TID) | INTRAMUSCULAR | Status: DC | PRN

## 2011-09-27 MED ORDER — KETOROLAC TROMETHAMINE 30 MG/ML IJ SOLN: 30.0000 mg | Freq: Four times a day (QID) | INTRAMUSCULAR | Status: AC | PRN

## 2011-09-27 MED ORDER — DIBUCAINE 1 % RE OINT: 1.0000 "application " | TOPICAL_OINTMENT | RECTAL | Status: DC | PRN

## 2011-09-27 MED ORDER — FENTANYL CITRATE 0.05 MG/ML IJ SOLN
INTRAMUSCULAR | Status: AC
  Filled 2011-09-27: qty 2

## 2011-09-27 MED ORDER — MEPERIDINE HCL 25 MG/ML IJ SOLN: 6.2500 mg | INTRAMUSCULAR | Status: DC | PRN

## 2011-09-27 MED ORDER — CITRIC ACID-SODIUM CITRATE 334-500 MG/5ML PO SOLN: 30.0000 mL | Freq: Once | ORAL | Status: DC

## 2011-09-27 MED ORDER — SCOPOLAMINE 1 MG/3DAYS TD PT72
1.0000 | MEDICATED_PATCH | Freq: Once | TRANSDERMAL | Status: DC
  Filled 2011-09-27: qty 1

## 2011-09-27 MED ORDER — EPHEDRINE SULFATE 50 MG/ML IJ SOLN
INTRAMUSCULAR | Status: DC | PRN
  Administered 2011-09-27 (×7): 10 mg via INTRAVENOUS

## 2011-09-27 MED ORDER — ZOLPIDEM TARTRATE 5 MG PO TABS: 5.0000 mg | ORAL_TABLET | Freq: Every evening | ORAL | Status: DC | PRN

## 2011-09-27 MED ORDER — MORPHINE SULFATE 0.5 MG/ML IJ SOLN
INTRAMUSCULAR | Status: AC
  Filled 2011-09-27: qty 10

## 2011-09-27 MED ORDER — SODIUM CHLORIDE 0.9 % IJ SOLN: 3.0000 mL | INTRAMUSCULAR | Status: DC | PRN

## 2011-09-27 MED ORDER — DIPHENHYDRAMINE HCL 50 MG/ML IJ SOLN
12.5000 mg | INTRAMUSCULAR | Status: DC | PRN
  Administered 2011-09-27: 12.5 mg via INTRAVENOUS

## 2011-09-27 MED ORDER — PHENYLEPHRINE 40 MCG/ML (10ML) SYRINGE FOR IV PUSH (FOR BLOOD PRESSURE SUPPORT)
PREFILLED_SYRINGE | INTRAVENOUS | Status: AC
  Filled 2011-09-27: qty 5

## 2011-09-27 MED ORDER — ONDANSETRON HCL 4 MG/2ML IJ SOLN
INTRAMUSCULAR | Status: DC | PRN
  Administered 2011-09-27: 4 mg via INTRAVENOUS

## 2011-09-27 MED ORDER — DIPHENHYDRAMINE HCL 25 MG PO CAPS: 25.0000 mg | ORAL_CAPSULE | ORAL | Status: DC | PRN

## 2011-09-27 MED ORDER — MEDROXYPROGESTERONE ACETATE 150 MG/ML IM SUSP: 150.0000 mg | INTRAMUSCULAR | Status: DC | PRN

## 2011-09-27 MED ORDER — KETOROLAC TROMETHAMINE 30 MG/ML IJ SOLN
INTRAMUSCULAR | Status: AC
  Administered 2011-09-27: 30 mg
  Filled 2011-09-27: qty 1

## 2011-09-27 MED ORDER — MORPHINE SULFATE (PF) 0.5 MG/ML IJ SOLN
INTRAMUSCULAR | Status: DC | PRN
  Administered 2011-09-27: .1 mg via INTRATHECAL

## 2011-09-27 MED ORDER — DIPHENHYDRAMINE HCL 25 MG PO CAPS
25.0000 mg | ORAL_CAPSULE | Freq: Four times a day (QID) | ORAL | Status: DC | PRN
  Administered 2011-09-27 (×2): 25 mg via ORAL
  Filled 2011-09-27 (×2): qty 1

## 2011-09-27 MED ORDER — SERTRALINE HCL 50 MG PO TABS
50.0000 mg | ORAL_TABLET | Freq: Every day | ORAL | Status: DC
  Administered 2011-09-27 – 2011-09-30 (×4): 50 mg via ORAL
  Filled 2011-09-27 (×6): qty 1

## 2011-09-27 MED ORDER — ONDANSETRON HCL 4 MG/2ML IJ SOLN
4.0000 mg | INTRAMUSCULAR | Status: DC | PRN
  Administered 2011-09-27: 4 mg via INTRAVENOUS

## 2011-09-27 MED ORDER — DEXTROSE 5 % IV SOLN
2.0000 g | INTRAVENOUS | Status: AC
  Administered 2011-09-27: 2 g via INTRAVENOUS
  Filled 2011-09-27: qty 2

## 2011-09-27 MED ORDER — NALBUPHINE HCL 10 MG/ML IJ SOLN
5.0000 mg | INTRAMUSCULAR | Status: DC | PRN
  Filled 2011-09-27: qty 1

## 2011-09-27 MED ORDER — TETANUS-DIPHTH-ACELL PERTUSSIS 5-2.5-18.5 LF-MCG/0.5 IM SUSP
0.5000 mL | Freq: Once | INTRAMUSCULAR | Status: DC
  Filled 2011-09-27 (×2): qty 0.5

## 2011-09-27 MED ORDER — FLEET ENEMA 7-19 GM/118ML RE ENEM: 1.0000 | ENEMA | Freq: Every day | RECTAL | Status: DC | PRN

## 2011-09-27 MED ORDER — FENTANYL CITRATE 0.05 MG/ML IJ SOLN
INTRAMUSCULAR | Status: DC | PRN
  Administered 2011-09-27: 15 ug via INTRATHECAL

## 2011-09-27 MED ORDER — BISACODYL 10 MG RE SUPP: 10.0000 mg | Freq: Every day | RECTAL | Status: DC | PRN

## 2011-09-27 MED ORDER — DEXTROSE 5 % IV SOLN
1.0000 g | INTRAVENOUS | Status: DC
  Filled 2011-09-27: qty 1

## 2011-09-27 MED ORDER — WITCH HAZEL-GLYCERIN EX PADS: 1.0000 "application " | MEDICATED_PAD | CUTANEOUS | Status: DC | PRN

## 2011-09-27 MED ORDER — MENTHOL 3 MG MT LOZG: 1.0000 | LOZENGE | OROMUCOSAL | Status: DC | PRN

## 2011-09-27 MED ORDER — OXYCODONE-ACETAMINOPHEN 5-325 MG PO TABS
1.0000 | ORAL_TABLET | ORAL | Status: DC | PRN
  Administered 2011-09-27 (×2): 2 via ORAL
  Administered 2011-09-28: 1 via ORAL
  Administered 2011-09-28 – 2011-09-29 (×6): 2 via ORAL
  Filled 2011-09-27: qty 2
  Filled 2011-09-27: qty 1
  Filled 2011-09-27 (×7): qty 2

## 2011-09-27 MED ORDER — OXYTOCIN 10 UNIT/ML IJ SOLN
INTRAMUSCULAR | Status: AC
  Filled 2011-09-27: qty 4

## 2011-09-27 MED ORDER — SODIUM CHLORIDE 0.9 % IR SOLN
Status: DC | PRN
  Administered 2011-09-27: 1000 mL

## 2011-09-27 MED ORDER — SIMETHICONE 80 MG PO CHEW: 80.0000 mg | CHEWABLE_TABLET | ORAL | Status: DC | PRN

## 2011-09-27 MED ORDER — LACTATED RINGERS IV SOLN
INTRAVENOUS | Status: DC
  Administered 2011-09-27 (×3): via INTRAVENOUS

## 2011-09-27 MED ORDER — ONDANSETRON HCL 4 MG PO TABS: 4.0000 mg | ORAL_TABLET | ORAL | Status: DC | PRN

## 2011-09-27 MED ORDER — KETOROLAC TROMETHAMINE 60 MG/2ML IM SOLN
60.0000 mg | Freq: Once | INTRAMUSCULAR | Status: DC | PRN
  Filled 2011-09-27: qty 2

## 2011-09-27 MED ORDER — DIPHENHYDRAMINE HCL 50 MG/ML IJ SOLN
25.0000 mg | INTRAMUSCULAR | Status: DC | PRN
  Filled 2011-09-27: qty 1

## 2011-09-27 MED ORDER — SIMETHICONE 80 MG PO CHEW
80.0000 mg | CHEWABLE_TABLET | Freq: Three times a day (TID) | ORAL | Status: DC
  Administered 2011-09-27 – 2011-09-29 (×8): 80 mg via ORAL

## 2011-09-27 MED ORDER — ONDANSETRON HCL 4 MG/2ML IJ SOLN
INTRAMUSCULAR | Status: AC
  Filled 2011-09-27: qty 2

## 2011-09-27 MED ORDER — OXYTOCIN 20 UNITS IN LACTATED RINGERS INFUSION - SIMPLE
125.0000 mL/h | INTRAVENOUS | Status: DC
  Filled 2011-09-27: qty 1000

## 2011-09-27 MED ORDER — LANOLIN HYDROUS EX OINT: 1.0000 "application " | TOPICAL_OINTMENT | CUTANEOUS | Status: DC | PRN

## 2011-09-27 MED ORDER — SODIUM CHLORIDE 0.9 % IV SOLN
1.0000 ug/kg/h | INTRAVENOUS | Status: DC | PRN
  Filled 2011-09-27: qty 2.5

## 2011-09-27 MED ORDER — NALOXONE HCL 0.4 MG/ML IJ SOLN: 0.4000 mg | INTRAMUSCULAR | Status: DC | PRN

## 2011-09-27 MED ORDER — EPHEDRINE 5 MG/ML INJ
INTRAVENOUS | Status: AC
  Filled 2011-09-27: qty 20

## 2011-09-27 MED ORDER — SENNOSIDES-DOCUSATE SODIUM 8.6-50 MG PO TABS
2.0000 | ORAL_TABLET | Freq: Every day | ORAL | Status: DC
  Administered 2011-09-27 – 2011-09-29 (×3): 2 via ORAL

## 2011-09-27 MED ORDER — OXYTOCIN 10 UNIT/ML IJ SOLN
INTRAMUSCULAR | Status: DC | PRN
  Administered 2011-09-27 (×2): 20 [IU] via INTRAMUSCULAR

## 2011-09-27 MED ORDER — FENTANYL CITRATE 0.05 MG/ML IJ SOLN: 25.0000 ug | INTRAMUSCULAR | Status: DC | PRN

## 2011-09-27 MED ORDER — LACTATED RINGERS IV SOLN: INTRAVENOUS | Status: DC

## 2011-09-27 MED ORDER — BUPIVACAINE HCL (PF) 0.25 % IJ SOLN
INTRAMUSCULAR | Status: AC
  Filled 2011-09-27: qty 30

## 2011-09-27 MED ORDER — FLUCONAZOLE 150 MG PO TABS
150.0000 mg | ORAL_TABLET | Freq: Once | ORAL | Status: AC
  Administered 2011-09-27: 150 mg via ORAL
  Filled 2011-09-27: qty 1

## 2011-09-27 MED ORDER — MEASLES, MUMPS & RUBELLA VAC ~~LOC~~ INJ
0.5000 mL | INJECTION | Freq: Once | SUBCUTANEOUS | Status: DC
  Filled 2011-09-27: qty 0.5

## 2011-09-27 MED ORDER — IBUPROFEN 600 MG PO TABS
600.0000 mg | ORAL_TABLET | Freq: Four times a day (QID) | ORAL | Status: DC | PRN
  Administered 2011-09-27: 600 mg via ORAL

## 2011-09-27 MED ORDER — LACTATED RINGERS IV SOLN
INTRAVENOUS | Status: DC
  Administered 2011-09-27: 17:00:00 via INTRAVENOUS

## 2011-09-27 MED ORDER — FAMOTIDINE IN NACL 20-0.9 MG/50ML-% IV SOLN
INTRAVENOUS | Status: AC
  Administered 2011-09-27: 20 mg
  Filled 2011-09-27: qty 50

## 2011-09-27 SURGICAL SUPPLY — 31 items
BARRIER ADHS 3X4 INTERCEED (GAUZE/BANDAGES/DRESSINGS) IMPLANT
BRR ADH 4X3 ABS CNTRL BYND (GAUZE/BANDAGES/DRESSINGS)
CHLORAPREP W/TINT 26ML (MISCELLANEOUS) ×2 IMPLANT
CLOTH BEACON ORANGE TIMEOUT ST (SAFETY) ×2 IMPLANT
CONTAINER PREFILL 10% NBF 15ML (MISCELLANEOUS) IMPLANT
DRSG COVADERM 4X10 (GAUZE/BANDAGES/DRESSINGS) ×1 IMPLANT
ELECT REM PT RETURN 9FT ADLT (ELECTROSURGICAL) ×2
ELECTRODE REM PT RTRN 9FT ADLT (ELECTROSURGICAL) ×1 IMPLANT
EXTRACTOR VACUUM M CUP 4 TUBE (SUCTIONS) IMPLANT
GLOVE BIO SURGEON STRL SZ 6.5 (GLOVE) ×4 IMPLANT
GOWN PREVENTION PLUS LG XLONG (DISPOSABLE) ×5 IMPLANT
KIT ABG SYR 3ML LUER SLIP (SYRINGE) IMPLANT
NDL HYPO 25X5/8 SAFETYGLIDE (NEEDLE) ×1 IMPLANT
NEEDLE HYPO 22GX1.5 SAFETY (NEEDLE) ×1 IMPLANT
NEEDLE HYPO 25X5/8 SAFETYGLIDE (NEEDLE) ×2 IMPLANT
NS IRRIG 1000ML POUR BTL (IV SOLUTION) ×2 IMPLANT
PACK C SECTION WH (CUSTOM PROCEDURE TRAY) ×2 IMPLANT
PAD ABD 7.5X8 STRL (GAUZE/BANDAGES/DRESSINGS) ×1 IMPLANT
SLEEVE SCD COMPRESS KNEE MED (MISCELLANEOUS) IMPLANT
STAPLER VISISTAT 35W (STAPLE) IMPLANT
SUT CHROMIC 0 CTX 36 (SUTURE) ×4 IMPLANT
SUT PLAIN 0 NONE (SUTURE) IMPLANT
SUT PLAIN 2 0 XLH (SUTURE) IMPLANT
SUT VIC AB 0 CT1 27 (SUTURE) ×10
SUT VIC AB 0 CT1 27XBRD ANBCTR (SUTURE) ×3 IMPLANT
SUT VIC AB 4-0 KS 27 (SUTURE) IMPLANT
SYR CONTROL 10ML LL (SYRINGE) ×1 IMPLANT
TAPE CLOTH SURG 4X10 WHT LF (GAUZE/BANDAGES/DRESSINGS) ×1 IMPLANT
TOWEL OR 17X24 6PK STRL BLUE (TOWEL DISPOSABLE) ×4 IMPLANT
TRAY FOLEY CATH 14FR (SET/KITS/TRAYS/PACK) ×2 IMPLANT
WATER STERILE IRR 1000ML POUR (IV SOLUTION) ×2 IMPLANT
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
## 2011-09-27 NOTE — Op Note (Signed)
Rachael Chapman, Rachael Chapman            ACCOUNT NO.:  [id number]  MEDICAL RECORD NO.:  [id number]  LOCATION:  [facility]                          [facility]ILITY:  [facility]  PHYSICIAN:  Everitt Wenner L. Tayvian Holycross, M.D.DATE OF BIRTH:  12/09/1980  DATE OF PROCEDURE:  09/27/2011 DATE OF DISCHARGE:                              OPERATIVE REPORT   PREOPERATIVE DIAGNOSES:  Intrauterine pregnancy at term, spontaneous rupture of membranes and large for gestational age and desires primary cesarean and gestational diabetes.  POSTOPERATIVE DIAGNOSES:  Intrauterine pregnancy at term, spontaneous rupture of membranes and large for gestational age and desires primary cesarean and gestational diabetes.  PROCEDURE:  Primary low transverse cesarean section.  SURGEON:  Trannie Bardales L. Lavaris Sexson, MD  ANESTHESIA:  Spinal.  ESTIMATED BLOOD LOSS:  Less than 500 mL.  COMPLICATIONS:  None.  DRAINS:  Foley.  PROCEDURE:  The patient was taken to the operating room.  She was prepped and draped after spinal was placed.  A Foley catheter was inserted.  A low transverse incision was made and carried down to the fascia.  Fascia was scored in midline and extended laterally.  Rectus muscles were separated in the midline.  Peritoneum was entered bluntly. Peritoneal incision was then stretched.  The bladder blade was then inserted.  The bladder blade was then readjusted.  A low transverse incision was made in the uterus.  Uterus was entered using a hemostat. The baby was in cephalic presentation, was delivered easily, and was noted to be LGA.  Apgars 9 at 1 minute and 9 at 5 minutes.  The cord was clamped and cut.  The baby had been handed to the awaiting neonatal team.  The placenta was manually removed and noted to be normal, intact with a three-vessel cord.  The uterus was exteriorized and cleared of all clots and debris.  It was closed in 2 layers using 0 chromic in a running locked stitch.  The uterus was returned to the  abdomen. Irrigation was performed.  Hemostasis was excellent.  The peritoneum was closed using 0 Vicryl running stitch.  The rectus muscles were closed using 0 Vicryl running stitch starting each corner meeting in the midline.  After irrigation of subcutaneous layer, the skin was closed with staples.  All sponge, lap, and instrument counts were correct x2.  The patient went to the recovery room in stable condition.     Rachael Chapman, M.D.     Rachael Chapman  D:  09/27/2011  T:  09/27/2011  Job:  [id number]

## 2011-09-27 NOTE — Progress Notes (Signed)
Dr Vincente Poli notified of pt's admission and status. Aware of SROM at 0515 with cl fld, hx gest diab., for prim c/s 5/1 for large baby

## 2011-09-27 NOTE — OR Nursing (Signed)
Uterus massaged by S. Osceola Depaz RN.  Two tubes of cord blood to lab.  

## 2011-09-27 NOTE — Brief Op Note (Signed)
09/27/2011  9:00 AM  PATIENT:  Rachael Chapman  31 y.o. female  PRE-OPERATIVE DIAGNOSIS:  IUP at 64 w 6 days, SROM, LGA, GDM  POST-OPERATIVE DIAGNOSIS:  Same  PROCEDURE:  Procedure(s) (LRB): Primary Low Transverse CESAREAN SECTION (N/A)  SURGEON:  Surgeon(s) and Role:    * Jeani Hawking, MD - Primary  PHYSICIAN ASSISTANT:   ASSISTANTS: none   ANESTHESIA:   spinal  EBL:  Total I/O In: 1000 [I.V.:1000] Out: 700 [Urine:200; Blood:500]  BLOOD ADMINISTERED:none  DRAINS: Urinary Catheter (Foley)   LOCAL MEDICATIONS USED:  NONE  SPECIMEN:  No Specimen  DISPOSITION OF SPECIMEN:  N/A  COUNTS:  YES  TOURNIQUET:  * No tourniquets in log *  DICTATION: .Other Dictation: Dictation Number [id number]  PLAN OF CARE: Admit to inpatient   PATIENT DISPOSITION:  PACU - hemodynamically stable.   Delay start of Pharmacological VTE agent (>24hrs) due to surgical blood loss or risk of bleeding: yes

## 2011-09-27 NOTE — H&P (Signed)
31 year old G 3 P 1011 presents with SROM clear fluid at 0515 with contractions. She has GDM and is on Glyburide and has a LGA baby. At 35 weeks her baby was at the 100% and after counseling by Dr. Renaldo Fiddler had elected to undergo Primary LTCS for LGA. She was scheduled for later this week.  PNC GDM diet controlled and on glyburide GBBS negative  Afebrile Vital signs stable Gen alert and oriented Lung CTAB Car RRR Abd is soft and non tender SROM clear fluid  IMPRESSION: SROM LGA GDB  PLAN: Primary LTCS Risks of procedure discussed with the patient Agree to proceed OR has been notified.

## 2011-09-27 NOTE — Anesthesia Postprocedure Evaluation (Signed)
Anesthesia Post Note  Patient: Rachael Chapman  Procedure(s) Performed: Procedure(s) (LRB): CESAREAN SECTION (N/A)  Anesthesia type: Spinal  Patient location: PACU  Post pain: Pain level controlled  Post assessment: Post-op Vital signs reviewed  Last Vitals:  Filed Vitals:   09/27/11 1000  BP: 137/74  Pulse: 60  Temp: 36.5 C  Resp: 18    Post vital signs: Reviewed  Level of consciousness: awake  Complications: No apparent anesthesia complications

## 2011-09-27 NOTE — MAU Note (Signed)
Got up about 0515 and felt gush of clear fld. Continue to leak fld. For C/S 5/1 due to large baby.

## 2011-09-27 NOTE — Transfer of Care (Signed)
Immediate Anesthesia Transfer of Care Note  Patient: Rachael Chapman  Procedure(s) Performed: Procedure(s) (LRB): CESAREAN SECTION (N/A)  Patient Location: PACU  Anesthesia Type: Spinal  Level of Consciousness: awake, alert  and oriented  Airway & Oxygen Therapy: Patient Spontanous Breathing  Post-op Assessment: Report given to PACU RN and Post -op Vital signs reviewed and stable  Post vital signs: stable  Complications: No apparent anesthesia complications

## 2011-09-27 NOTE — Anesthesia Procedure Notes (Signed)
Spinal  Patient location during procedure: OR Start time: 09/27/2011 8:21 AM Staffing Performed by: anesthesiologist  Preanesthetic Checklist Completed: patient identified, site marked, surgical consent, pre-op evaluation, timeout performed, IV checked, risks and benefits discussed and monitors and equipment checked Spinal Block Patient position: sitting Prep: site prepped and draped and DuraPrep Patient monitoring: heart rate, cardiac monitor, continuous pulse ox and blood pressure Approach: midline Location: L3-4 Injection technique: single-shot Needle Needle type: Sprotte  Needle gauge: 24 G Needle length: 9 cm Assessment Sensory level: T4 Additional Notes Clear free flow CSF on first attempt.  No paresthesia.  Patient tolerated procedure well.  Jasmine December, MD

## 2011-09-28 LAB — CBC
MCH: 26.2 pg (ref 26.0–34.0)
MCV: 82.9 fL (ref 78.0–100.0)
Platelets: 301 10*3/uL (ref 150–400)
RBC: 3.85 MIL/uL — ABNORMAL LOW (ref 3.87–5.11)
RDW: 13.6 % (ref 11.5–15.5)

## 2011-09-28 NOTE — Addendum Note (Signed)
Addendum  created 09/28/11 4540 by Suella Grove, CRNA   Modules edited:Notes Section

## 2011-09-28 NOTE — Progress Notes (Cosign Needed)
Subjective: Postpartum Day 1: Cesarean Delivery Patient reports tolerating PO, + flatus and no problems voiding.    Objective: Vital signs in last 24 hours: Temp:  [97.4 F (36.3 C)-99 F (37.2 C)] 98.2 F (36.8 C) (04/29 0551) Pulse Rate:  [57-86] 86  (04/29 0551) Resp:  [16-20] 18  (04/29 0551) BP: (100-142)/(66-78) 108/74 mmHg (04/29 0551) SpO2:  [95 %-100 %] 95 % (04/29 0551)  Physical Exam:  General: alert and cooperative Lochia: appropriate Uterine Fundus: firm Incision: abd dressing noted with old drainage on bandage DVT Evaluation: No evidence of DVT seen on physical exam. CBG- 88 mg /dl this am   Basename 16/10/96 0530 09/27/11 0705  HGB 10.1* 11.3*  HCT 31.9* 35.7*    Assessment/Plan: Status post Cesarean section. Doing well postoperatively.  Patient to check postprandiol BS this am  Alea Ryer G 09/28/2011, 8:43 AM

## 2011-09-28 NOTE — Anesthesia Postprocedure Evaluation (Signed)
  Anesthesia Post-op Note  Patient: Rachael Chapman  Procedure(s) Performed: Procedure(s) (LRB): CESAREAN SECTION (N/A)  Patient Location: [facility]  Anesthesia Type: Spinal  Level of Consciousness: awake  Airway and Oxygen Therapy: Patient Spontanous Breathing  Post-op Pain: none  Post-op Assessment: Patient's Cardiovascular Status Stable, Respiratory Function Stable, Patent Airway, No signs of Nausea or vomiting, Adequate PO intake, Pain level not controlled, No headache, No backache, No residual numbness and No residual motor weakness  Post-op Vital Signs: Reviewed and stable  Complications: No apparent anesthesia complications

## 2011-09-28 NOTE — Progress Notes (Signed)
Rachael Chapman was in good spirits when I visited her.  She had 2 friends with her and they were very happy to see the baby who is doing well.  She did not wish to visit more at this time, but is aware of on-going availability of spiritual care services.    [name] Dalores Weger Pager, [phone] 10:30   09/28/11 1100  Clinical Encounter Type  Visited With Patient and family together  Visit Type Initial

## 2011-09-28 NOTE — Progress Notes (Signed)
UR chart review completed.  

## 2011-09-28 NOTE — Progress Notes (Signed)
Pt checked CBG on own machine: 88 (fasting)

## 2011-09-28 NOTE — Progress Notes (Signed)

## 2011-09-29 ENCOUNTER — Encounter (HOSPITAL_COMMUNITY): Payer: Self-pay | Admitting: Obstetrics and Gynecology

## 2011-09-29 NOTE — Progress Notes (Signed)
Subjective: Postpartum Day 2: Cesarean Delivery Patient reports incisional pain, tolerating PO, + flatus and no problems voiding.  Baby doing well. BS stabilizing on 24 calorie formula. Plan for discharge tomorrow  Objective: Vital signs in last 24 hours: Temp:  [97.7 F (36.5 C)-98.6 F (37 C)] 98.6 F (37 C) (04/30 0533) Pulse Rate:  [87-94] 87  (04/30 0533) Resp:  [18] 18  (04/30 0533) BP: (103-139)/(70-77) 139/77 mmHg (04/30 0533) SpO2:  [95 %-96 %] 95 % (04/30 0533)  Physical Exam:  General: alert and cooperative Lochia: appropriate Uterine Fundus: firm Incision: healing well, staples intact DVT Evaluation: No evidence of DVT seen on physical exam. BS 87-88 mg/dl   Basename 09/81/19 1478 09/27/11 0705  HGB 10.1* 11.3*  HCT 31.9* 35.7*    Assessment/Plan: Status post Cesarean section. Doing well postoperatively.  Continue current care.  CURTIS,CAROL G 09/29/2011, 8:11 AM

## 2011-09-30 ENCOUNTER — Inpatient Hospital Stay (HOSPITAL_COMMUNITY): Admission: RE | Admit: 2011-09-30 | Payer: 59 | Source: Ambulatory Visit | Admitting: Obstetrics and Gynecology

## 2011-09-30 ENCOUNTER — Encounter (HOSPITAL_COMMUNITY): Admission: RE | Payer: Self-pay | Source: Ambulatory Visit

## 2011-09-30 SURGERY — Surgical Case
Anesthesia: Regional

## 2011-09-30 MED ORDER — SERTRALINE HCL 50 MG PO TABS: 50.0000 mg | ORAL_TABLET | Freq: Every day | ORAL | Status: DC

## 2011-09-30 MED ORDER — IBUPROFEN 600 MG PO TABS: 600.0000 mg | ORAL_TABLET | Freq: Four times a day (QID) | ORAL | Status: AC

## 2011-09-30 MED ORDER — OXYCODONE-ACETAMINOPHEN 5-325 MG PO TABS: 1.0000 | ORAL_TABLET | ORAL | Status: AC | PRN

## 2011-09-30 NOTE — Progress Notes (Signed)
Subjective: Postpartum Day 3: Cesarean Delivery Patient reports tolerating PO, + flatus and no problems voiding.    Objective: Vital signs in last 24 hours: Temp:  [98.1 F (36.7 C)-98.5 F (36.9 C)] 98.3 F (36.8 C) (05/01 0653) Pulse Rate:  [82-102] 83  (05/01 0653) Resp:  [18] 18  (05/01 0653) BP: (112-126)/(66-82) 120/82 mmHg (05/01 0653) SpO2:  [97 %-98 %] 97 % (05/01 0653)  Physical Exam:  General: alert and cooperative Lochia: appropriate Uterine Fundus: firm Incision: healing well, staples intact under pannus DVT Evaluation: No evidence of DVT seen on physical exam.   Basename 09/28/11 0530  HGB 10.1*  HCT 31.9*    Assessment/Plan: Status post Cesarean section. Doing well postoperatively.  Discharge home with standard precautions and return to clinic in 2 days for staple removal.  Swathi Dauphin G 09/30/2011, 8:21 AM

## 2011-09-30 NOTE — Progress Notes (Signed)
Pt is discharged in the care of husbnad. Downstairs per ambulatory. Stable. Staples are intact, pt to have staples removed on Friday. Small amt of locia on Vpad. Infant  Discharged in Mothers arms.

## 2011-09-30 NOTE — Discharge Summary (Signed)
Obstetric Discharge Summary Reason for Admission: rupture of membranes Prenatal Procedures: ultrasound Intrapartum Procedures: cesarean: low cervical, transverse Postpartum Procedures: none Complications-Operative and Postpartum: none Hemoglobin  Date Value Range Status  09/28/2011 10.1* 12.0-15.0 (g/dL) Final     HCT  Date Value Range Status  09/28/2011 31.9* 36.0-46.0 (%) Final    Physical Exam:  General: alert and cooperative Lochia: appropriate Uterine Fundus: firm Incision: healing well, staples in place DVT Evaluation: No evidence of DVT seen on physical exam.  Discharge Diagnoses: Term Pregnancy-delivered  Discharge Information: Date: 09/30/2011 Activity: pelvic rest Diet: routine Medications: PNV, Ibuprofen, Percocet and zoloft Condition: stable Instructions: refer to practice specific booklet Discharge to: home   Newborn Data: Live born female  Birth Weight: 10 lb 8.9 oz (4789 g) APGAR: 8, 9  Home with mother.  Rachael Chapman G 09/30/2011, 8:34 AM

## 2012-04-04 ENCOUNTER — Emergency Department (HOSPITAL_COMMUNITY)
Admission: EM | Admit: 2012-04-04 | Discharge: 2012-04-04 | Disposition: A | Discharge status: Home / Self Care | Payer: 59 | Source: Home / Self Care | Attending: Emergency Medicine | Admitting: Emergency Medicine

## 2012-04-04 ENCOUNTER — Encounter (HOSPITAL_COMMUNITY): Payer: Self-pay

## 2012-04-04 DIAGNOSIS — N949 Unspecified condition associated with female genital organs and menstrual cycle: Secondary | ICD-10-CM

## 2012-04-04 DIAGNOSIS — N938 Other specified abnormal uterine and vaginal bleeding: Secondary | ICD-10-CM

## 2012-04-04 LAB — CBC WITH DIFFERENTIAL/PLATELET
Eosinophils Relative: 2 % (ref 0–5)
HCT: 37.9 % (ref 36.0–46.0)
Lymphocytes Relative: 54 % — ABNORMAL HIGH (ref 12–46)
Lymphs Abs: 3.1 10*3/uL (ref 0.7–4.0)
MCH: 28.5 pg (ref 26.0–34.0)
MCV: 86.5 fL (ref 78.0–100.0)
Monocytes Absolute: 0.4 10*3/uL (ref 0.1–1.0)
Monocytes Relative: 7 % (ref 3–12)
RBC: 4.38 MIL/uL (ref 3.87–5.11)
WBC: 5.8 10*3/uL (ref 4.0–10.5)

## 2012-04-04 LAB — POCT PREGNANCY, URINE: Preg Test, Ur: NEGATIVE

## 2012-04-04 LAB — POCT URINALYSIS DIP (DEVICE)
Bilirubin Urine: NEGATIVE
Glucose, UA: NEGATIVE mg/dL
Leukocytes, UA: NEGATIVE
Nitrite: NEGATIVE
pH: 6 (ref 5.0–8.0)

## 2012-04-04 LAB — WET PREP, GENITAL: Trich, Wet Prep: NONE SEEN

## 2012-04-04 MED ORDER — MEDROXYPROGESTERONE ACETATE 10 MG PO TABS: 10.0000 mg | ORAL_TABLET | Freq: Every day | ORAL | Status: DC

## 2012-04-04 MED ORDER — NORGESTIM-ETH ESTRAD TRIPHASIC 0.18/0.215/0.25 MG-35 MCG PO TABS: 1.0000 | ORAL_TABLET | Freq: Every day | ORAL | Status: DC

## 2012-04-04 NOTE — ED Provider Notes (Signed)
Chief Complaint  Patient presents with  . Vaginal Bleeding    History of Present Illness:   Rachael Chapman is a 31 year old female who presents tonight with a three-day history of menorrhagia. She had a C-section 6 months ago and had one normal menses in July which lasted just 4 days. 3 days ago she began to have vaginal bleeding again which was heavy. She was soaking through 1 super size tampon about every hour. She passed some clots, had some cramps, and some pelvic pressure. Yesterday she started mini dose birth control pills since she is still nursing. She has used condoms for birth control previous to this. She does not think she is pregnant. She denies fever, chills, nausea, vomiting, or anorexia. No constipation, diarrhea, or blood in the stool. No urinary symptoms. She denies any vaginal discharge or itching. She does have a history of urinary tract infection recently and is on antibiotic. She is allergic to codeine. She had gestational diabetes.  Review of Systems:  Other than noted above, the patient denies any of the following symptoms: Systemic:  No fever, chills, sweats, fatigue, or weight loss. GI:  No abdominal pain, nausea, anorexia, vomiting, diarrhea, constipation, melena or hematochezia. GU:  No dysuria, frequency, urgency, hematuria, vaginal discharge, itching, or abnormal vaginal bleeding. Skin:  No rash or itching.  PMFSH:  Past medical history, family history, social history, meds, and allergies were reviewed.  Physical Exam:   Vital signs:  BP 127/87  Pulse 78  Temp 99 F (37.2 C) (Oral)  Resp 16  SpO2 100%  LMP 04/02/2012 General:  Alert, oriented and in no distress. Lungs:  Breath sounds clear and equal bilaterally.  No wheezes, rales or rhonchi. Heart:  Regular rhythm.  No gallops or murmers. Abdomen:  Soft, flat and non-distended.  No organomegaly or mass.  No tenderness, guarding or rebound.  Bowel sounds normally active. Pelvic exam:  Normal external genitalia,  vaginal and cervical mucosa were normal. The cervix was closed. There is a small amount of blood in the vaginal vault. No clots or tissue. No cervical motion pain. Uterus was midposition normal in size and shape and nontender. There was no adnexal tenderness or mass. Skin:  Clear, warm and dry.  Labs:   Results for orders placed during the hospital encounter of 04/04/12  CBC WITH DIFFERENTIAL      Component Value Range   WBC 5.8  4.0 - 10.5 K/uL   RBC 4.38  3.87 - 5.11 MIL/uL   Hemoglobin 12.5  12.0 - 15.0 g/dL   HCT 14.7  82.9 - 56.2 %   MCV 86.5  78.0 - 100.0 fL   MCH 28.5  26.0 - 34.0 pg   MCHC 33.0  30.0 - 36.0 g/dL   RDW 13.0  86.5 - 78.4 %   Platelets 310  150 - 400 K/uL   Neutrophils Relative 38 (*) 43 - 77 %   Neutro Abs 2.2  1.7 - 7.7 K/uL   Lymphocytes Relative 54 (*) 12 - 46 %   Lymphs Abs 3.1  0.7 - 4.0 K/uL   Monocytes Relative 7  3 - 12 %   Monocytes Absolute 0.4  0.1 - 1.0 K/uL   Eosinophils Relative 2  0 - 5 %   Eosinophils Absolute 0.1  0.0 - 0.7 K/uL   Basophils Relative 0  0 - 1 %   Basophils Absolute 0.0  0.0 - 0.1 K/uL  WET PREP, GENITAL      Component Value  Range   Yeast Wet Prep HPF POC NONE SEEN  NONE SEEN   Trich, Wet Prep NONE SEEN  NONE SEEN   Clue Cells Wet Prep HPF POC FEW (*) NONE SEEN   WBC, Wet Prep HPF POC MODERATE (*) NONE SEEN  POCT URINALYSIS DIP (DEVICE)      Component Value Range   Glucose, UA NEGATIVE  NEGATIVE mg/dL   Bilirubin Urine NEGATIVE  NEGATIVE   Ketones, ur 15 (*) NEGATIVE mg/dL   Specific Gravity, Urine 1.020  1.005 - 1.030   Hgb urine dipstick MODERATE (*) NEGATIVE   pH 6.0  5.0 - 8.0   Protein, ur NEGATIVE  NEGATIVE mg/dL   Urobilinogen, UA 0.2  0.0 - 1.0 mg/dL   Nitrite NEGATIVE  NEGATIVE   Leukocytes, UA NEGATIVE  NEGATIVE  POCT PREGNANCY, URINE      Component Value Range   Preg Test, Ur NEGATIVE  NEGATIVE     Assessment:  The encounter diagnosis was Dysfunctional uterine bleeding.  Plan:   1.  The following  meds were prescribed:   New Prescriptions   MEDROXYPROGESTERONE (PROVERA) 10 MG TABLET    Take 1 tablet (10 mg total) by mouth daily.   NORGESTIMATE-ETHINYL ESTRADIOL TRIPHASIC (ORTHO TRI-CYCLEN, 28,) 0.18/0.215/0.25 MG-35 MCG TABLET    Take 1 tablet by mouth daily.   2.  The patient was instructed in symptomatic care and handouts were given. 3.  The patient was told to return if becoming worse in any way, if no better in 3 or 4 days, and given some red flag symptoms that would indicate earlier return.    Reuben Likes, MD 04/04/12 [id number]

## 2012-04-04 NOTE — ED Notes (Signed)
C/o abnormal/excessive vaginal bleeding, patient states this is her second cycle since her c-section 6 months ago, 1st cycle was normal. States she is also being treated for a UTI and started the Mini pill. Sx started 04/02/2012

## 2012-11-20 IMAGING — US US FETAL BPP W/O NONSTRESS
1 series · 14 of 23 positions shown · non-contrast
Comparison: none

[Series 1: us fetal bpp w/o nonstress · non-contrast · 23 acquisitions, 14 frames shown]
[im 1/23]
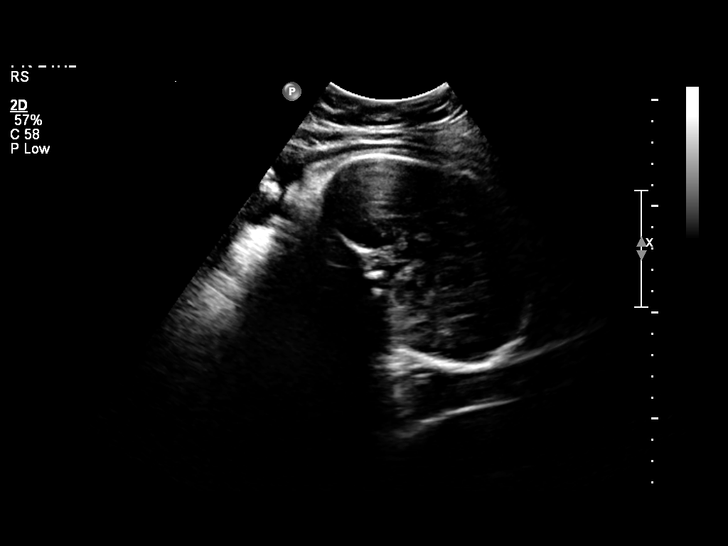
[im 3/23]
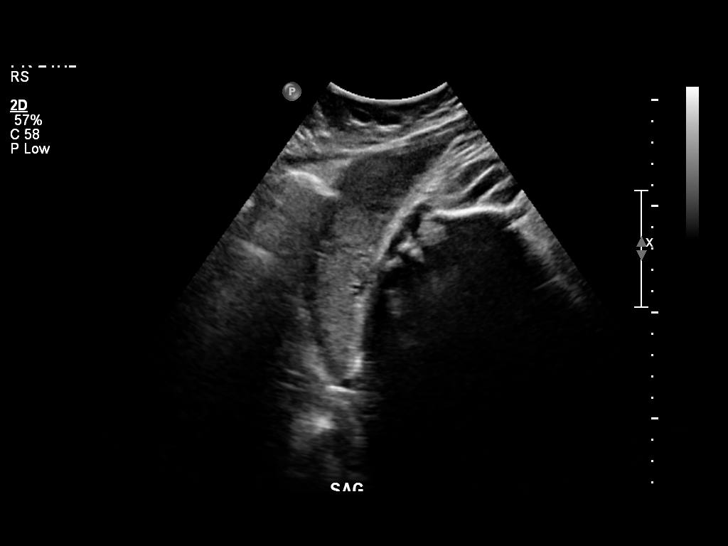
[im 5/23]
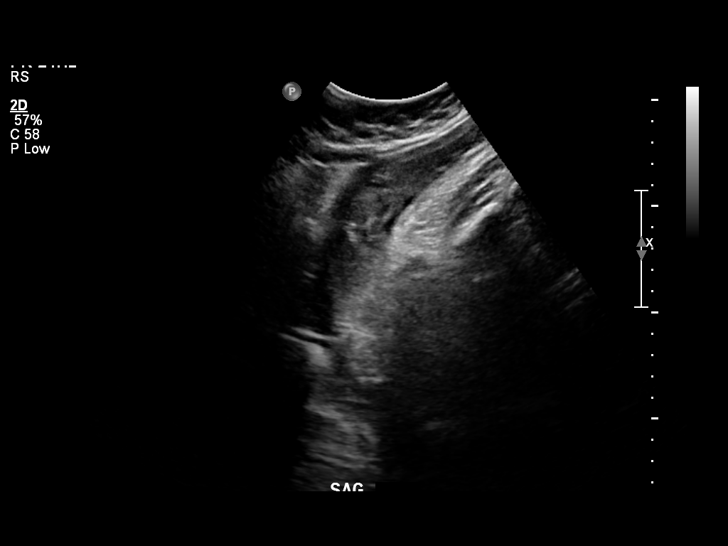
[im 6/23]
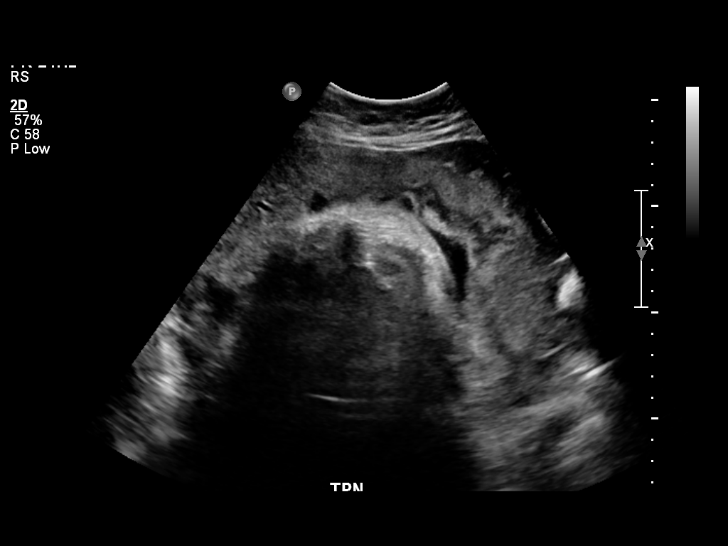
[im 8/23]
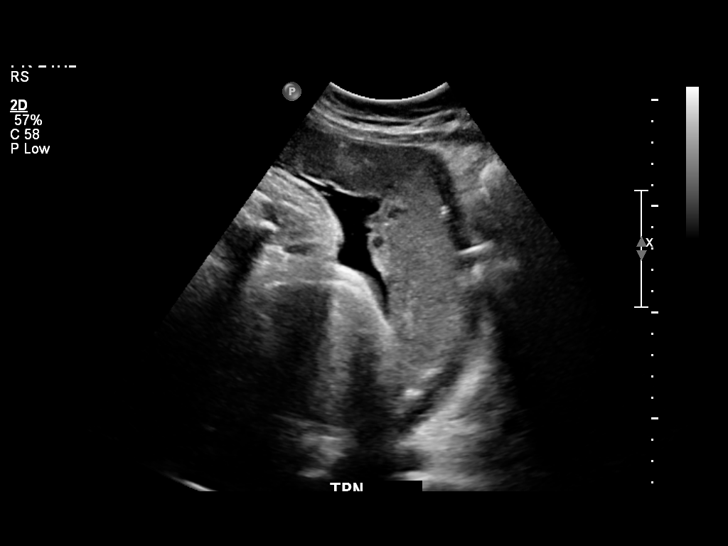
[im 10/23]
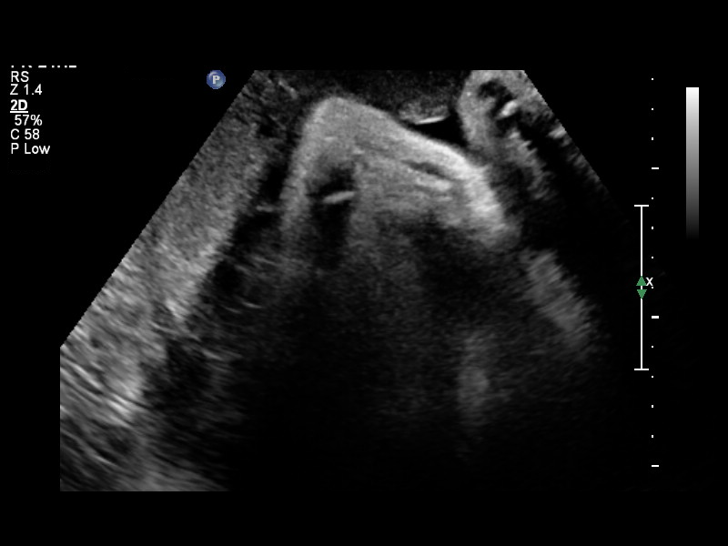
[im 11/23]
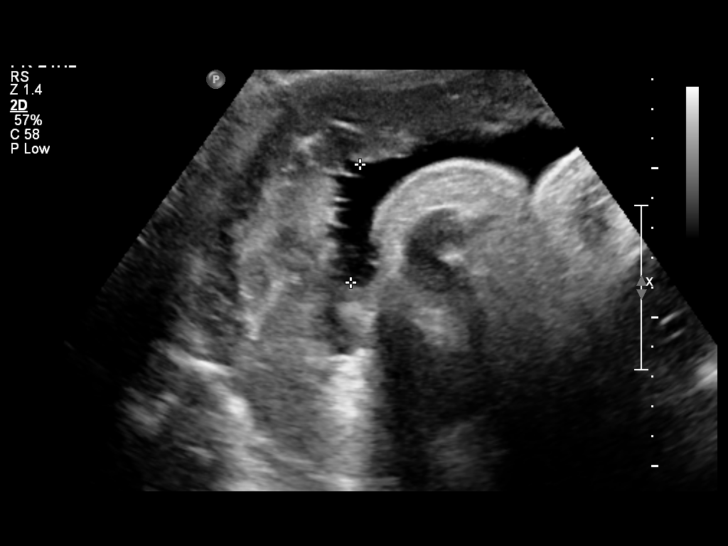
[im 13/23]
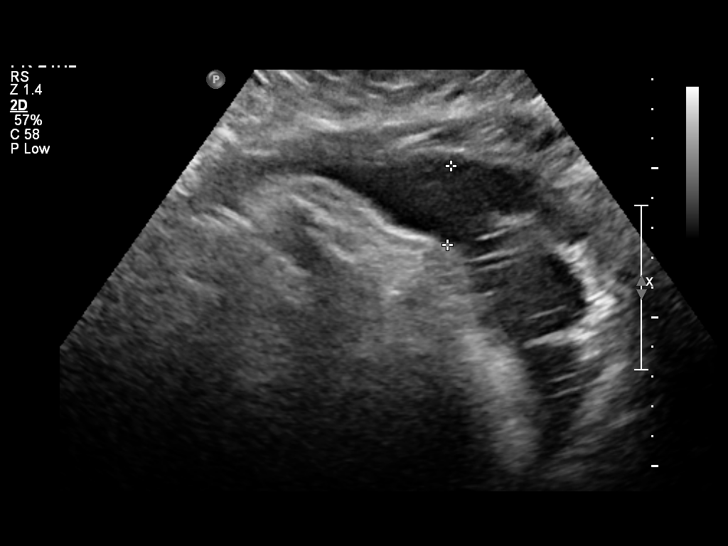
[im 14/23]
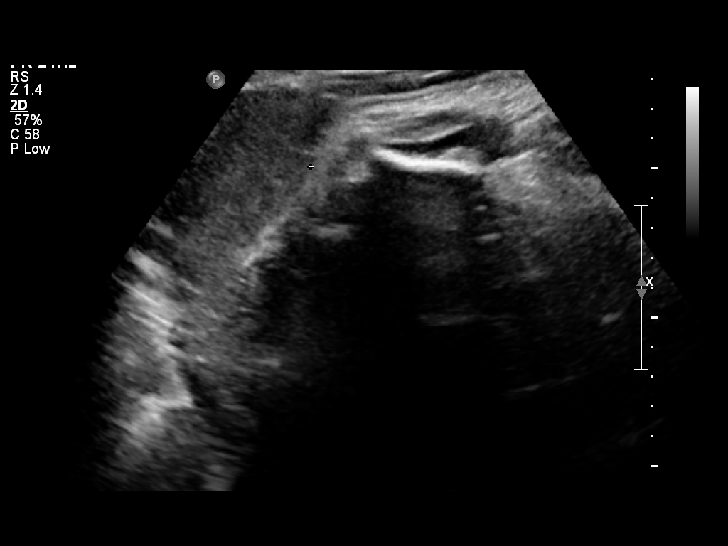
[im 16/23]
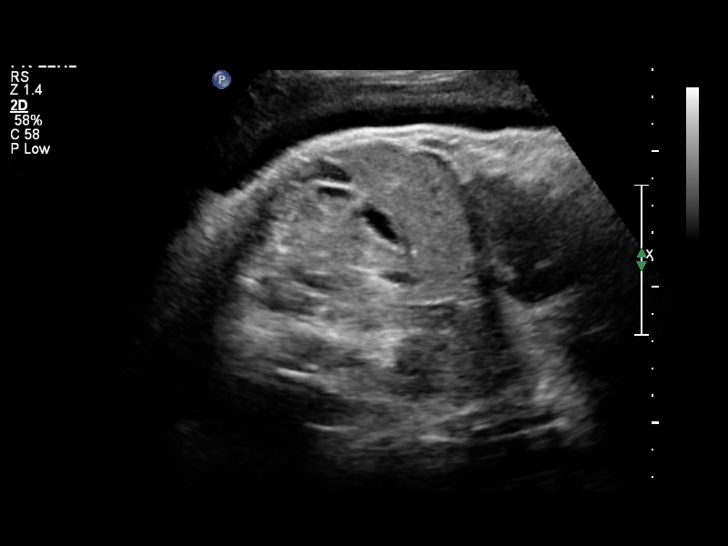
[im 18/23]
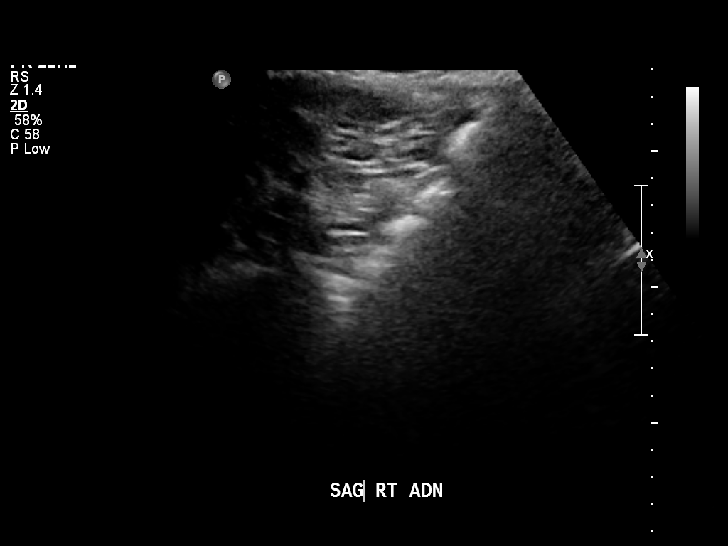
[im 19/23]
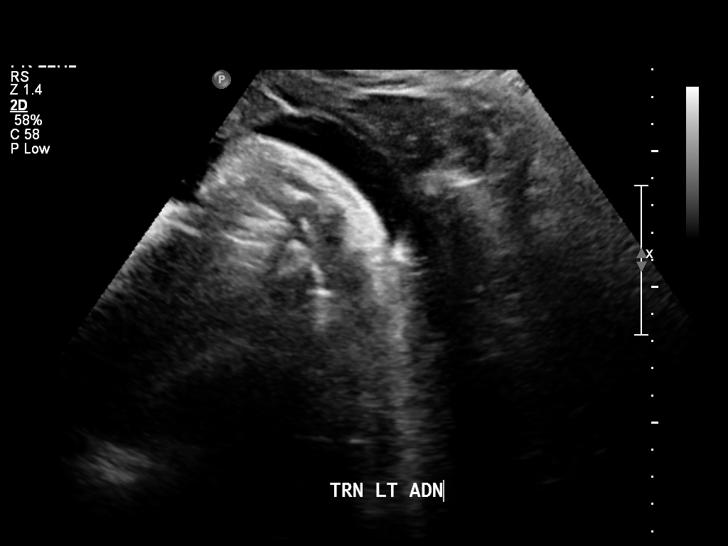
[im 21/23]
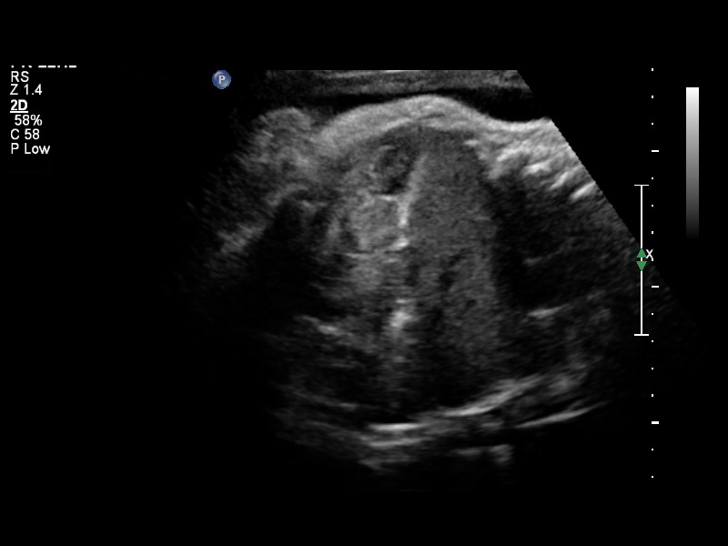
[im 23/23]
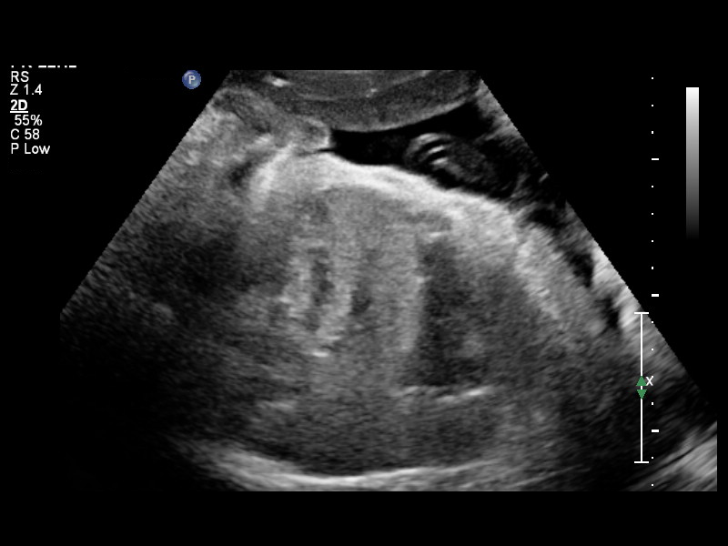

[14 of 23 positions shown; findings below may reference images not displayed]

Canned report from images found in remote index.

Refer to host system for actual result text.

## 2013-05-23 ENCOUNTER — Telehealth: Payer: Self-pay

## 2013-05-23 NOTE — Telephone Encounter (Signed)
New patient Medication and allergies:  Reviewed and updated  90 day supply/mail order: na Local pharmacy: [facility]   Immunizations due:  Doesn't remember last Tdap  A/P:   No changes to FH or PSH or personal hx Pap--GPW--past due per patient Uses Mirena  To Discuss with Provider: Controlling glucose and weight management

## 2013-05-26 ENCOUNTER — Ambulatory Visit (INDEPENDENT_AMBULATORY_CARE_PROVIDER_SITE_OTHER): Payer: Managed Care, Other (non HMO) | Admitting: Family Medicine

## 2013-05-26 ENCOUNTER — Other Ambulatory Visit: Payer: Self-pay | Admitting: General Practice

## 2013-05-26 ENCOUNTER — Encounter: Payer: Self-pay | Admitting: Family Medicine

## 2013-05-26 VITALS — BP 112/80 | HR 90 | Temp 98.4°F | Resp 16 | Ht 69.75 in | Wt 253.0 lb

## 2013-05-26 DIAGNOSIS — E282 Polycystic ovarian syndrome: Secondary | ICD-10-CM

## 2013-05-26 DIAGNOSIS — E669 Obesity, unspecified: Secondary | ICD-10-CM

## 2013-05-26 DIAGNOSIS — E010 Iodine-deficiency related diffuse (endemic) goiter: Secondary | ICD-10-CM | POA: Insufficient documentation

## 2013-05-26 DIAGNOSIS — E049 Nontoxic goiter, unspecified: Secondary | ICD-10-CM

## 2013-05-26 LAB — CBC WITH DIFFERENTIAL/PLATELET
Basophils Relative: 0.7 % (ref 0.0–3.0)
Eosinophils Absolute: 0.2 10*3/uL (ref 0.0–0.7)
Lymphocytes Relative: 29 % (ref 12.0–46.0)
MCHC: 33 g/dL (ref 30.0–36.0)
Neutrophils Relative %: 58.1 % (ref 43.0–77.0)
RBC: 4.46 Mil/uL (ref 3.87–5.11)
WBC: 8.3 10*3/uL (ref 4.5–10.5)

## 2013-05-26 LAB — LIPID PANEL
LDL Cholesterol: 86 mg/dL (ref 0–99)
Total CHOL/HDL Ratio: 5
VLDL: 15 mg/dL (ref 0.0–40.0)

## 2013-05-26 LAB — HEMOGLOBIN A1C: Hgb A1c MFr Bld: 6.9 % — ABNORMAL HIGH (ref 4.6–6.5)

## 2013-05-26 LAB — BASIC METABOLIC PANEL
BUN: 11 mg/dL (ref 6–23)
Chloride: 104 mEq/L (ref 96–112)
Potassium: 4.1 mEq/L (ref 3.5–5.1)

## 2013-05-26 LAB — HEPATIC FUNCTION PANEL
ALT: 15 U/L (ref 0–35)
AST: 17 U/L (ref 0–37)
Bilirubin, Direct: 0.1 mg/dL (ref 0.0–0.3)
Total Bilirubin: 0.9 mg/dL (ref 0.3–1.2)

## 2013-05-26 MED ORDER — METFORMIN HCL 500 MG PO TABS: 500.0000 mg | ORAL_TABLET | Freq: Two times a day (BID) | ORAL | Status: DC

## 2013-05-26 NOTE — Assessment & Plan Note (Signed)
New.  Encouraged regular, aerobic activity for 30 minutes at least 4x/week and monitoring caloric intake w/ the help of MyFitnessPal app.  Will follow. 

## 2013-05-26 NOTE — Progress Notes (Signed)
   Subjective:    Patient ID: Rachael Chapman, female    DOB: [birth year], 32 y.o.   MRN: [id number]  HPI New to establish.  Previous MD- [facility].  GYN- [facility], due for pap  Psych- Dr Haywood Lasso ([facility])  Obesity- pt reports family hx of diabetes.  Having difficult time fitting exercise into schedule, 'i hate exercising'.  Hx of gestational diabetes.  Thyromegaly- pt aware that she has enlarged thyroid, has never had [place].  Last had labs ~2 yrs ago.  Having difficult time losing weight.  + fatigue.  Denies dry or brittle hair/nails.  Denies palpitations.  No N/V/D.  Persistent juvenile repolarization- 'people think i'm dying when i have an EKG'.  Has seen [facility])  Review of Systems For ROS see HPI     Objective:   Physical Exam  Vitals reviewed. Constitutional: She is oriented to person, place, and time. She appears well-developed and well-nourished. No distress.  HENT:  Head: Normocephalic and atraumatic.  Eyes: Conjunctivae and EOM are normal. Pupils are equal, round, and reactive to light.  Neck: Normal range of motion. Neck supple. Thyromegaly present.  Cardiovascular: Normal rate, regular rhythm, normal heart sounds and intact distal pulses.   No murmur heard. Pulmonary/Chest: Effort normal and breath sounds normal. No respiratory distress.  Abdominal: Soft. She exhibits no distension. There is no tenderness.  Musculoskeletal: She exhibits no edema.  Lymphadenopathy:    She has no cervical adenopathy.  Neurological: She is alert and oriented to person, place, and time.  Skin: Skin is warm and dry.  Psychiatric: She has a normal mood and affect. Her behavior is normal.          Assessment & Plan:

## 2013-05-26 NOTE — Assessment & Plan Note (Signed)
New to provider, ongoing for pt.  Not currently on meds.  Hx of glucose intolerance and gestational diabetes.  Check labs.  Start metformin prn.  Pt expressed understanding and is in agreement w/ plan.

## 2013-05-26 NOTE — Assessment & Plan Note (Signed)
New to provider, ongoing for pt.  Check labs.  Get US to assess. 

## 2013-05-26 NOTE — Patient Instructions (Signed)
Follow up in 3 months to check weight loss progress Focus on healthy diet and attempt regular exercise- any activity is better than no activity Use the aid of apps or tools like MyFitnessPal or Fitbit We'll notify you of your lab results and make any changes if needed Call with any questions or concerns Welcome!  We're glad to have you! Happy New Year!

## 2013-05-29 ENCOUNTER — Ambulatory Visit (HOSPITAL_BASED_OUTPATIENT_CLINIC_OR_DEPARTMENT_OTHER): Payer: Managed Care, Other (non HMO)

## 2013-06-05 ENCOUNTER — Telehealth: Payer: Self-pay | Admitting: *Deleted

## 2013-06-05 NOTE — Telephone Encounter (Signed)
Please advise. SW 

## 2013-06-05 NOTE — Telephone Encounter (Signed)
Can stop Metformin but Glyburide is not my next choice for diabetes medication.  Can provide samples of Januvia and a coupon card and send script for #30, 6 refills

## 2013-06-05 NOTE — Telephone Encounter (Signed)
Patient called and stated that she tried the metformin for a week and its making her have stomach pains. Patient would like to be switch to Glyburide if possible.    [phone] - [place], [place] - [address] AT [facility] [address] & [address]

## 2013-06-06 ENCOUNTER — Ambulatory Visit (HOSPITAL_BASED_OUTPATIENT_CLINIC_OR_DEPARTMENT_OTHER): Payer: Managed Care, Other (non HMO)

## 2013-06-07 ENCOUNTER — Ambulatory Visit (HOSPITAL_BASED_OUTPATIENT_CLINIC_OR_DEPARTMENT_OTHER)
Admission: RE | Admit: 2013-06-07 | Discharge: 2013-06-07 | Disposition: A | Discharge status: Home / Self Care | Payer: Managed Care, Other (non HMO) | Source: Ambulatory Visit | Attending: Family Medicine | Admitting: Family Medicine

## 2013-06-07 DIAGNOSIS — E041 Nontoxic single thyroid nodule: Secondary | ICD-10-CM | POA: Insufficient documentation

## 2013-06-07 DIAGNOSIS — E010 Iodine-deficiency related diffuse (endemic) goiter: Secondary | ICD-10-CM

## 2013-06-07 NOTE — Telephone Encounter (Signed)
Patient states that she will continue the metformin. Patient states that she has talked with a couple of friends and stated that the diarrhea should get better over time.Patient states that she will give it about 2 more weeks and if it doesn't get any better than she will give the office a call back. SW

## 2013-07-03 ENCOUNTER — Ambulatory Visit (INDEPENDENT_AMBULATORY_CARE_PROVIDER_SITE_OTHER): Payer: Managed Care, Other (non HMO) | Admitting: Family Medicine

## 2013-07-03 ENCOUNTER — Encounter: Payer: Self-pay | Admitting: Family Medicine

## 2013-07-03 VITALS — BP 122/76 | HR 82 | Temp 98.4°F | Resp 16 | Wt 256.2 lb

## 2013-07-03 DIAGNOSIS — E282 Polycystic ovarian syndrome: Secondary | ICD-10-CM

## 2013-07-03 DIAGNOSIS — J029 Acute pharyngitis, unspecified: Secondary | ICD-10-CM

## 2013-07-03 DIAGNOSIS — E1165 Type 2 diabetes mellitus with hyperglycemia: Secondary | ICD-10-CM | POA: Insufficient documentation

## 2013-07-03 DIAGNOSIS — Z202 Contact with and (suspected) exposure to infections with a predominantly sexual mode of transmission: Secondary | ICD-10-CM

## 2013-07-03 DIAGNOSIS — E119 Type 2 diabetes mellitus without complications: Secondary | ICD-10-CM

## 2013-07-03 HISTORY — DX: Type 2 diabetes mellitus without complications: E11.9

## 2013-07-03 LAB — POCT RAPID STREP A (OFFICE): Rapid Strep A Screen: NEGATIVE

## 2013-07-03 NOTE — Assessment & Plan Note (Signed)
New.  Pt is on metformin but not happy w/ side effects.  Due to complicating PCOS dx, will refer to endo for ongoing management.  Pt expressed understanding and is in agreement w/ plan.

## 2013-07-03 NOTE — Progress Notes (Signed)
Pre visit review using our clinic review tool, if applicable. No additional management support is needed unless otherwise documented below in the visit note. 

## 2013-07-03 NOTE — Progress Notes (Signed)
   Subjective:    Patient ID: Rachael Chapman, female    DOB: 01-04-1981, 33 y.o.   MRN: [id number]  HPI URI- sxs started 4 days ago w/ sore throat.  No fever.  Having tender LAD.  Painful to swallow.  No sinus pain.  Bilateral ear fullness w/ sensation of drainage.  No cough.  No known sick contacts.  Mild nausea  STD exposure- pt reports she and her husband will 'get along' w/ other couples.  Asking to be checked.  DM/PCOS- pt reports she wants to switch to Glyburide due to nausea and loose stools on metformin.   Review of Systems For ROS see HPI     Objective:   Physical Exam  Vitals reviewed. Constitutional: She appears well-developed and well-nourished. No distress.  HENT:  Head: Normocephalic and atraumatic.  Right Ear: Tympanic membrane normal.  Left Ear: Tympanic membrane normal.  Nose: Mucosal edema and rhinorrhea present. Right sinus exhibits no maxillary sinus tenderness and no frontal sinus tenderness. Left sinus exhibits no maxillary sinus tenderness and no frontal sinus tenderness.  Mouth/Throat: Mucous membranes are normal. Posterior oropharyngeal erythema (w/ PND) present.  Eyes: Conjunctivae and EOM are normal. Pupils are equal, round, and reactive to light.  Neck: Normal range of motion. Neck supple.  Cardiovascular: Normal rate, regular rhythm and normal heart sounds.   Pulmonary/Chest: Effort normal and breath sounds normal. No respiratory distress. She has no wheezes. She has no rales.  Lymphadenopathy:    She has no cervical adenopathy.          Assessment & Plan:

## 2013-07-03 NOTE — Assessment & Plan Note (Signed)
New.  Pt admits that she and her husband both take 'liberties' in their marriage.  Would like to be checked for possible STDs- ordered.

## 2013-07-03 NOTE — Assessment & Plan Note (Signed)
New.  Strep test was negative- will send cx.  Treat as viral illness.  Reviewed supportive care and red flags that should prompt return.  Pt expressed understanding and is in agreement w/ plan.

## 2013-07-03 NOTE — Patient Instructions (Signed)
Follow up as needed We'll notify you of your lab results and make any changes if needed Drink plenty of fluids Alternate tylenol and ibuprofen every 4 hrs for pain or fever REST! We'll call you with the culture results- the strep test was negative so this is likely viral and will improve w/ time Call with any questions or concerns Hang in there!!!

## 2013-07-03 NOTE — Addendum Note (Signed)
Addended by: Silvio PateHOMPSON, Fawnda Vitullo D on: 07/03/2013 02:44 PM   Modules accepted: Orders

## 2013-07-03 NOTE — Assessment & Plan Note (Signed)
Ongoing for pt.  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  Refer to endo for ongoing management as pt doesn't like metformin.  Will follow.

## 2013-07-04 LAB — RPR

## 2013-07-04 LAB — HIV ANTIBODY (ROUTINE TESTING W REFLEX): HIV: NONREACTIVE

## 2013-07-04 LAB — GC/CHLAMYDIA PROBE AMP, URINE
CHLAMYDIA, SWAB/URINE, PCR: NEGATIVE
GC PROBE AMP, URINE: NEGATIVE

## 2013-07-05 ENCOUNTER — Encounter: Payer: Self-pay | Admitting: Family Medicine

## 2013-07-05 LAB — CULTURE, GROUP A STREP: ORGANISM ID, BACTERIA: NORMAL

## 2013-07-07 ENCOUNTER — Telehealth: Payer: Self-pay

## 2013-07-07 NOTE — Telephone Encounter (Signed)
Relevant patient education assigned to patient using Emmi. ° °

## 2013-07-25 ENCOUNTER — Ambulatory Visit: Payer: Managed Care, Other (non HMO) | Admitting: Internal Medicine

## 2013-08-24 ENCOUNTER — Ambulatory Visit: Payer: Managed Care, Other (non HMO) | Admitting: Family Medicine

## 2013-08-25 ENCOUNTER — Encounter: Payer: Self-pay | Admitting: Internal Medicine

## 2013-08-25 ENCOUNTER — Ambulatory Visit (INDEPENDENT_AMBULATORY_CARE_PROVIDER_SITE_OTHER): Payer: Managed Care, Other (non HMO) | Admitting: Internal Medicine

## 2013-08-25 VITALS — BP 114/62 | HR 94 | Temp 98.6°F | Resp 12 | Ht 68.5 in | Wt 260.0 lb

## 2013-08-25 DIAGNOSIS — E119 Type 2 diabetes mellitus without complications: Secondary | ICD-10-CM

## 2013-08-25 LAB — MICROALBUMIN / CREATININE URINE RATIO
CREATININE, U: 269.1 mg/dL
Microalb Creat Ratio: 0.3 mg/g (ref 0.0–30.0)
Microalb, Ur: 0.9 mg/dL (ref 0.0–1.9)

## 2013-08-25 LAB — HEMOGLOBIN A1C: Hgb A1c MFr Bld: 7.1 % — ABNORMAL HIGH (ref 4.6–6.5)

## 2013-08-25 MED ORDER — METFORMIN HCL ER 500 MG PO TB24: 500.0000 mg | ORAL_TABLET | Freq: Two times a day (BID) | ORAL | Status: DC

## 2013-08-25 MED ORDER — GLUCOSE BLOOD VI STRP: ORAL_STRIP | Status: DC

## 2013-08-25 MED ORDER — FREESTYLE LANCETS MISC: Status: DC

## 2013-08-25 NOTE — Patient Instructions (Signed)
Please stop the regular Metformin and start Glucophage XR with dinner x 3 days. If you tolerate this well, add another Metformin tablet (500 mg) with breakfast. Check sugars once a day, rotating checks. Please return in 4 months with your sugar log.   PATIENT INSTRUCTIONS FOR TYPE 2 DIABETES:  DIET AND EXERCISE Diet and exercise is an important part of diabetic treatment.  We recommended aerobic exercise in the form of brisk walking (working between 40-60% of maximal aerobic capacity, similar to brisk walking) for 150 minutes per week (such as 30 minutes five days per week) along with 3 times per week performing 'resistance' training (using various gauge rubber tubes with handles) 5-10 exercises involving the major muscle groups (upper body, lower body and core) performing 10-15 repetitions (or near fatigue) each exercise. Start at half the above goal but build slowly to reach the above goals. If limited by weight, joint pain, or disability, we recommend daily walking in a swimming pool with water up to waist to reduce pressure from joints while allow for adequate exercise.    BLOOD GLUCOSES Monitoring your blood glucoses is important for continued management of your diabetes. Please check your blood glucoses 1-4 times a day: fasting, before meals and at bedtime (you can rotate these measurements - e.g. one day check before the 3 meals, the next day check before 2 of the meals and before bedtime, etc.   HYPOGLYCEMIA (low blood sugar) Hypoglycemia is usually a reaction to not eating, exercising, or taking too much insulin/ other diabetes drugs.  Symptoms include tremors, sweating, hunger, confusion, headache, etc. Treat IMMEDIATELY with 15 grams of Carbs:   4 glucose tablets    cup regular juice/soda   2 tablespoons raisins   4 teaspoons sugar   1 tablespoon honey Recheck blood glucose in 15 mins and repeat above if still symptomatic/blood glucose <100. Please contact our office at  [phone] if you have questions about how to next handle your insulin.  RECOMMENDATIONS TO REDUCE YOUR RISK OF DIABETIC COMPLICATIONS: * Take your prescribed MEDICATION(S). * Follow a DIABETIC diet: Complex carbs, fiber rich foods, heart healthy fish twice weekly, (monounsaturated and polyunsaturated) fats * AVOID saturated/trans fats, high fat foods, >2,300 mg salt per day. * EXERCISE at least 5 times a week for 30 minutes or preferably daily.  * DO NOT SMOKE OR DRINK more than 1 drink a day. * Check your FEET every day. Do not wear tightfitting shoes. Contact us if you develop an ulcer * See your EYE doctor once a year or more if needed * Get a FLU shot once a year * Get a PNEUMONIA vaccine once before and once after age 34 years  GOALS:  * Your Hemoglobin A1c of <7%  * fasting sugars need to be <130 * after meals sugars need to be <180 (2h after you start eating) * Your Systolic BP should be 140 or lower  * Your Diastolic BP should be 80 or lower  * Your HDL (Good Cholesterol) should be 40 or higher  * Your LDL (Bad Cholesterol) should be 100 or lower  * Your Triglycerides should be 150 or lower  * Your Urine microalbumin (kidney function) should be <30 * Your Body Mass Index should be 25 or lower   We will be glad to help you achieve these goals. Our telephone number is: [phone].
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
## 2013-08-28 ENCOUNTER — Other Ambulatory Visit: Payer: Self-pay | Admitting: *Deleted

## 2013-08-28 MED ORDER — FREESTYLE LANCETS MISC: Status: DC

## 2013-08-28 MED ORDER — GLUCOSE BLOOD VI STRP: ORAL_STRIP | Status: DC

## 2013-10-02 ENCOUNTER — Encounter: Payer: Self-pay | Admitting: Family Medicine

## 2013-11-07 ENCOUNTER — Ambulatory Visit (INDEPENDENT_AMBULATORY_CARE_PROVIDER_SITE_OTHER): Payer: Managed Care, Other (non HMO) | Admitting: Family Medicine

## 2013-11-07 ENCOUNTER — Encounter: Payer: Self-pay | Admitting: Family Medicine

## 2013-11-07 VITALS — BP 126/86 | HR 83 | Temp 98.2°F | Resp 16 | Wt 266.5 lb

## 2013-11-07 DIAGNOSIS — M5410 Radiculopathy, site unspecified: Secondary | ICD-10-CM | POA: Insufficient documentation

## 2013-11-07 DIAGNOSIS — IMO0002 Reserved for concepts with insufficient information to code with codable children: Secondary | ICD-10-CM

## 2013-11-07 MED ORDER — MELOXICAM 15 MG PO TABS: 15.0000 mg | ORAL_TABLET | Freq: Every day | ORAL | Status: DC

## 2013-11-07 MED ORDER — KETOROLAC TROMETHAMINE 60 MG/2ML IM SOLN
60.0000 mg | Freq: Once | INTRAMUSCULAR | Status: AC
  Administered 2013-11-07: 60 mg via INTRAMUSCULAR

## 2013-11-07 MED ORDER — CYCLOBENZAPRINE HCL 10 MG PO TABS: 10.0000 mg | ORAL_TABLET | Freq: Three times a day (TID) | ORAL | Status: DC | PRN

## 2013-11-07 NOTE — Progress Notes (Signed)
   Subjective:    Patient ID: Rachael Chapman, female    DOB: [birth year], 33 y.o.   MRN: [id number]  HPI Back pain- pt sat on broken chair yesterday and it collapsed underneath her.  Pt fell to the floor.  Has hx of back pain previously.  Now entire R side is painful, radiating into butt and thigh.  Unable to get comfortable sitting, standing, lying.   Review of Systems For ROS see HPI     Objective:   Physical Exam  Vitals reviewed. Constitutional: She is oriented to person, place, and time. She appears well-developed and well-nourished. No distress (but obviously uncomfortable).  Cardiovascular: Intact distal pulses.   Musculoskeletal:  + TTP over R lumbar paraspinal muscles, + spasm No TTP over spine + TTP over R SI joint  Neurological: She is alert and oriented to person, place, and time. She has normal reflexes.  + SLR on R          Assessment & Plan:

## 2013-11-07 NOTE — Progress Notes (Signed)
Pre visit review using our clinic review tool, if applicable. No additional management support is needed unless otherwise documented below in the visit note. 

## 2013-11-07 NOTE — Patient Instructions (Signed)
Follow up as needed Start the Meloxicam tomorrow w/ breakfast (anti-inflammatory- the toradol injection will cover you for today) Start the Cyclobenzaprine today for muscle spasm HEAT! If no improvement in the next 7-10 days, please call! Hang in there!!!

## 2013-11-07 NOTE — Assessment & Plan Note (Signed)
New to provider, recurrent for pt.  toradol injxn in office.  Start scheduled NSAIDs tomorrow.  Flexeril prn.  Heat.  Reviewed supportive care and red flags that should prompt return.  Pt expressed understanding and is in agreement w/ plan.

## 2013-11-11 ENCOUNTER — Encounter: Payer: Self-pay | Admitting: Family Medicine

## 2013-11-15 ENCOUNTER — Other Ambulatory Visit: Payer: Self-pay | Admitting: Family Medicine

## 2013-11-15 MED ORDER — NALTREXONE-BUPROPION HCL ER 8-90 MG PO TB12: ORAL_TABLET | ORAL | Status: DC

## 2013-11-29 ENCOUNTER — Encounter: Payer: Self-pay | Admitting: Family Medicine

## 2013-12-25 ENCOUNTER — Encounter: Payer: Self-pay | Admitting: Internal Medicine

## 2013-12-25 ENCOUNTER — Ambulatory Visit (INDEPENDENT_AMBULATORY_CARE_PROVIDER_SITE_OTHER): Payer: Managed Care, Other (non HMO) | Admitting: Internal Medicine

## 2013-12-25 VITALS — BP 118/80 | HR 74 | Temp 99.0°F | Resp 12 | Wt 271.0 lb

## 2013-12-25 DIAGNOSIS — E119 Type 2 diabetes mellitus without complications: Secondary | ICD-10-CM

## 2013-12-25 LAB — HEMOGLOBIN A1C: Hgb A1c MFr Bld: 7.6 % — ABNORMAL HIGH (ref 4.6–6.5)

## 2013-12-25 MED ORDER — CANAGLIFLOZIN 100 MG PO TABS: ORAL_TABLET | ORAL | Status: DC

## 2013-12-25 NOTE — Progress Notes (Signed)
Patient ID: Rachael Chapman, female   DOB: 1980/12/22, 33 y.o.   MRN: [id number]  HPI: Rachael Chapman is a 33 y.o.-year-old female, returning for f/u for DM2, in 05/2013, previous GDM 2012/13, non-insulin-dependent, uncontrolled, without complications. Last visit 4 mo ago.  Last hemoglobin A1c was: Lab Results  Component Value Date   HGBA1C 7.1* 08/25/2013   HGBA1C 6.9* 05/26/2013   HGBA1C 6.2 09/11/2009   Pt is on a regimen of: - Metformin XR 500 mg at night >> still diarrhea She took Glyburide during the pregnancy. Metformin >> diarrhea, gas, AP.  Pt checks her sugars: - am: 86-96 - 2h after meals: 150-170 No lows; she has hypoglycemia awareness at 80.   Pt's meals are: - Breakfast: yoghurt/bagel/cereal/croissant - Lunch: pizza/sandwich/salad/TV dinner - Dinner: chicken, veggies, starch - Snacks: a lot - always hungry  Tried Medifast diet >> lost 30 lbs in 4 mo before pregnancy. She is reconsidering this.  - no CKD, last BUN/creatinine:  Lab Results  Component Value Date   BUN 11 05/26/2013   CREATININE 0.7 05/26/2013   - last set of lipids: Lab Results  Component Value Date   CHOL 127 05/26/2013   HDL 26.10* 05/26/2013   LDLCALC 86 05/26/2013   TRIG 75.0 05/26/2013   CHOLHDL 5 05/26/2013   - last eye exam was in 08/2012. No DR.  - no numbness and tingling in her feet.  I reviewed pt's medications, allergies, PMH, social hx, family hx and no changes required, except as mentioned above. She topped Zoloft. She and her husband are thinking to have another baby.  ROS: Constitutional: + weight gain, + fatigue, + subjective hyperthermia, + increased urination, + poor sleep, + nocturia Eyes: no blurry vision, no xerophthalmia ENT: no sore throat, no nodules palpated in throat, no dysphagia/odynophagia, no hoarseness Cardiovascular: no CP/SOB/palpitations/leg swelling Respiratory: no cough/SOB Gastrointestinal: no N/V/+D/no C Musculoskeletal: + muscle aches/joint  aches Skin: no rashes Neurological: no tremors/numbness/tingling/dizziness Low libido  PE: BP 118/80  Pulse 74  Temp(Src) 99 F (37.2 C) (Oral)  Resp 12  Wt 271 lb (122.925 kg)  SpO2 97% Wt Readings from Last 3 Encounters:  12/25/13 271 lb (122.925 kg)  11/07/13 266 lb 8 oz (120.884 kg)  08/25/13 260 lb (117.935 kg)   Constitutional: overweight, in NAD Eyes: PERRLA, EOMI, no exophthalmos ENT: moist mucous membranes, no thyromegaly, no cervical lymphadenopathy Cardiovascular: RRR, No MRG Respiratory: CTA B Gastrointestinal: abdomen soft, NT, ND, BS+ Musculoskeletal: no deformities, strength intact in all 4 Skin: moist, warm, no rashes Neurological: no tremor with outstretched hands, DTR normal in all 4  ASSESSMENT: 1. DM2, non-insulin-dependent, uncontrolled, without complications  PLAN:  1. Patient with recent dx of DM on minimal oral antidiabetic regimen. She cannot tolerate even Metformin XR b/c diarrhea. - We discussed about options for treatment, and I suggested to:  Patient Instructions  Please start Invokana 100 mg in am. Please return in 1.5 months with your sugar log.  - we discussed about SEs of Invokana, which are: dizziness (advised to be careful when stands from sitting position), decreased BP - usually not < normal (BP today is not low), and fungal UTIs (advised to let me know if develops one).  - given discount card for Invokana - continue checking sugars at different times of the day - check once a day, rotating checks - advised for yearly eye exams >> has one coming up - check A1c today - Return to clinic in 1.5 mo with sugar  log   Office Visit on 12/25/2013  Component Date Value Ref Range Status  . Hemoglobin A1C 12/25/2013 7.6* 4.6 - 6.5 % Final   Glycemic Control Guidelines for People with Diabetes:Non Diabetic:  <6%Goal of Therapy: <7%Additional Action Suggested:  >8%    HbA1c higher. See plan above.

## 2013-12-25 NOTE — Patient Instructions (Signed)
Please start Invokana 100 mg in am. Please return in 1.5 months with your sugar log.

## 2014-02-02 ENCOUNTER — Emergency Department (HOSPITAL_COMMUNITY)
Admission: EM | Admit: 2014-02-02 | Discharge: 2014-02-02 | Disposition: A | Discharge status: Home / Self Care | Payer: Managed Care, Other (non HMO) | Attending: Emergency Medicine | Admitting: Emergency Medicine

## 2014-02-02 ENCOUNTER — Emergency Department (HOSPITAL_COMMUNITY): Payer: Managed Care, Other (non HMO)

## 2014-02-02 ENCOUNTER — Encounter (HOSPITAL_COMMUNITY): Payer: Self-pay | Admitting: Emergency Medicine

## 2014-02-02 DIAGNOSIS — Z79899 Other long term (current) drug therapy: Secondary | ICD-10-CM | POA: Insufficient documentation

## 2014-02-02 DIAGNOSIS — F3289 Other specified depressive episodes: Secondary | ICD-10-CM | POA: Insufficient documentation

## 2014-02-02 DIAGNOSIS — R0789 Other chest pain: Secondary | ICD-10-CM | POA: Diagnosis not present

## 2014-02-02 DIAGNOSIS — Z8719 Personal history of other diseases of the digestive system: Secondary | ICD-10-CM | POA: Diagnosis not present

## 2014-02-02 DIAGNOSIS — Z3202 Encounter for pregnancy test, result negative: Secondary | ICD-10-CM | POA: Diagnosis not present

## 2014-02-02 DIAGNOSIS — R079 Chest pain, unspecified: Secondary | ICD-10-CM | POA: Insufficient documentation

## 2014-02-02 DIAGNOSIS — Z87891 Personal history of nicotine dependence: Secondary | ICD-10-CM | POA: Insufficient documentation

## 2014-02-02 DIAGNOSIS — R51 Headache: Secondary | ICD-10-CM | POA: Insufficient documentation

## 2014-02-02 DIAGNOSIS — J45901 Unspecified asthma with (acute) exacerbation: Secondary | ICD-10-CM | POA: Diagnosis not present

## 2014-02-02 DIAGNOSIS — F329 Major depressive disorder, single episode, unspecified: Secondary | ICD-10-CM | POA: Diagnosis not present

## 2014-02-02 LAB — BASIC METABOLIC PANEL
Anion gap: 11 (ref 5–15)
BUN: 8 mg/dL (ref 6–23)
CHLORIDE: 100 meq/L (ref 96–112)
CO2: 27 meq/L (ref 19–32)
Calcium: 9.8 mg/dL (ref 8.4–10.5)
Creatinine, Ser: 0.84 mg/dL (ref 0.50–1.10)
GFR calc Af Amer: >90 mL/min (ref >90)
GFR calc non Af Amer: 90 mL/min — ABNORMAL LOW (ref >90)
GLUCOSE: 123 mg/dL — AB (ref 70–99)
POTASSIUM: 4.3 meq/L (ref 3.7–5.3)
Sodium: 138 mEq/L (ref 137–147)

## 2014-02-02 LAB — CBC
HCT: 40.8 % (ref 36.0–46.0)
HEMOGLOBIN: 14.1 g/dL (ref 12.0–15.0)
MCH: 30 pg (ref 26.0–34.0)
MCHC: 34.6 g/dL (ref 30.0–36.0)
MCV: 86.8 fL (ref 78.0–100.0)
Platelets: 333 10*3/uL (ref 150–400)
RBC: 4.7 MIL/uL (ref 3.87–5.11)
RDW: 12.4 % (ref 11.5–15.5)
WBC: 8.8 10*3/uL (ref 4.0–10.5)

## 2014-02-02 LAB — D-DIMER, QUANTITATIVE: D-Dimer, Quant: 0.32 ug/mL-FEU (ref 0.00–0.48)

## 2014-02-02 LAB — PREGNANCY, URINE: Preg Test, Ur: NEGATIVE

## 2014-02-02 LAB — CBG MONITORING, ED: GLUCOSE-CAPILLARY: 119 mg/dL — AB (ref 70–99)

## 2014-02-02 LAB — TROPONIN I

## 2014-02-02 MED ORDER — LORAZEPAM 1 MG PO TABS
1.0000 mg | ORAL_TABLET | Freq: Once | ORAL | Status: DC
  Filled 2014-02-02: qty 1

## 2014-02-02 MED ORDER — ACETAMINOPHEN 325 MG PO TABS
650.0000 mg | ORAL_TABLET | Freq: Once | ORAL | Status: AC
  Administered 2014-02-02: 650 mg via ORAL
  Filled 2014-02-02: qty 2

## 2014-02-02 NOTE — ED Notes (Signed)
Pt is here with chest pain that started around bra line and proceed up left side and in her back shoulder blade.  Complains of left arm feeling cold and heavy in both arm and leg that started 30 minutes ago.  Pt has no drifts or grip weakness, no facial deficits.  Pt is diabetic

## 2014-02-02 NOTE — ED Notes (Signed)
Pt monitored by pulse ox, bp cuff, and 5-lead. 

## 2014-02-02 NOTE — ED Notes (Signed)
Pt. Reports left sided chest pain radiating to back with heaviness and numbness to left arm. Also reports HA/lightheadedness with nausea. States the pain has gotten better but is still having the heaviness to left side of body. Left arm and cheek have decreased sensation, otherwise CNS intact. Alert and oriented x4.
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
## 2014-02-02 NOTE — Discharge Instructions (Signed)
Return to the ED with any concerns including difficulty breathing, worsening chest pain, leg swelling, decreased level of alertness/lethargy, or any other alarming symptoms °

## 2014-02-02 NOTE — ED Notes (Signed)
Spoke with Dr. Karma Ganja regarding left arm, leg heaviness, and funny feeling in face and no deficits objectively noted and does not feel needs code stroke called.

## 2014-02-06 ENCOUNTER — Ambulatory Visit (INDEPENDENT_AMBULATORY_CARE_PROVIDER_SITE_OTHER): Payer: Managed Care, Other (non HMO) | Admitting: Internal Medicine

## 2014-02-06 ENCOUNTER — Encounter: Payer: Self-pay | Admitting: Internal Medicine

## 2014-02-06 VITALS — BP 112/68 | HR 95 | Temp 98.6°F | Resp 12 | Wt 264.0 lb

## 2014-02-06 DIAGNOSIS — E119 Type 2 diabetes mellitus without complications: Secondary | ICD-10-CM

## 2014-02-06 NOTE — Patient Instructions (Signed)
Please continue Invokana 100 mg in am. Please return in 1.5 months with your sugar log (after 10/27).

## 2014-02-06 NOTE — Progress Notes (Signed)
Patient ID: Rachael Chapman, female   DOB: [birth year], 33 y.o.   MRN: [id number]  HPI: Rachael Chapman is a 33 y.o.-year-old female, returning for f/u for DM2, in 05/2013, previous GDM 2012/13, non-insulin-dependent, uncontrolled, without complications. Last visit 1.5 mo ago.  She was in the ED recently for a panic attack.  Last hemoglobin A1c was: Lab Results  Component Value Date   HGBA1C 7.6* 12/25/2013   HGBA1C 7.1* 08/25/2013   HGBA1C 6.9* 05/26/2013   Pt is on a regimen of: - Invokana 100 mg - added 11/2013 We stopped Metformin XR 500 mg at night >> still diarrhea. She took Glyburide during the pregnancy. Metformin >> diarrhea, gas, AP.  Pt checks her sugars: - am: 86-96 >> n/c - 2h after lunch: 169 - 2h after dinner: 150-170 >> 189 No lows; she has hypoglycemia awareness at 80.  Highest: 189 x 2 since last visit.  Pt's meals are: - Breakfast: yoghurt/bagel/cereal/croissant - Lunch: pizza/sandwich/salad/TV dinner - Dinner: chicken, veggies, starch - Snacks: a lot - always hungry  Tried Medifast diet >> lost 30 lbs in 4 mo before pregnancy.  - no CKD, last BUN/creatinine:  Lab Results  Component Value Date   BUN 8 02/02/2014   CREATININE 0.84 02/02/2014   - last set of lipids: Lab Results  Component Value Date   CHOL 127 05/26/2013   HDL 26.10* 05/26/2013   LDLCALC 86 05/26/2013   TRIG 75.0 05/26/2013   CHOLHDL 5 05/26/2013   - last eye exam was in 08/2012. No DR.  - no numbness and tingling in her feet.  I reviewed pt's medications, allergies, PMH, social hx, family hx and no changes required, except as mentioned above. She increased the dose of Brillinta to 20 mg daily. She and her husband are thinking to have another baby.  ROS: Constitutional: + weight loss, + increased appetite, + increased urination, + poor sleep, + nocturia Eyes: no blurry vision, no xerophthalmia ENT: + sore throat, no nodules palpated in throat, no dysphagia/odynophagia, no  hoarseness Cardiovascular: no CP/SOB/palpitations/leg swelling Respiratory: no cough/SOB Gastrointestinal: no N/V/D/C Musculoskeletal: no muscle aches/joint aches Skin: no rashes Neurological: no tremors/numbness/tingling/dizziness Low libido  PE: BP 112/68  Pulse 95  Temp(Src) 98.6 F (37 C) (Oral)  Resp 12  Wt 264 lb (119.75 kg)  SpO2 98% Wt Readings from Last 3 Encounters:  02/06/14 264 lb (119.75 kg)  12/25/13 271 lb (122.925 kg)  11/07/13 266 lb 8 oz (120.884 kg)   Constitutional: overweight, in NAD Eyes: PERRLA, EOMI, no exophthalmos ENT: moist mucous membranes, no thyromegaly, no cervical lymphadenopathy Cardiovascular: RRR, No MRG Respiratory: CTA B Gastrointestinal: abdomen soft, NT, ND, BS+ Musculoskeletal: no deformities, strength intact in all 4 Skin: moist, warm, no rashes Neurological: no tremor with outstretched hands, DTR normal in all 4  ASSESSMENT: 1. DM2, non-insulin-dependent, uncontrolled, without complications  PLAN:  1. Patient with recent dx of DM onInvokana only. She cannot tolerate even Metformin XR b/c diarrhea. Sugars not worsened, but not many checks since last visit. - I suggested to:  Patient Instructions  Please continueInvokana 100 mg in am. Please return in 1.5 months with your sugar log.  - continue checking sugars at different times of the day - check once a day, rotating checks >> she did not check much since last visit >> restart checking - advised for yearly eye exams  - check A1c at next visit - Return to clinic in 1.5 mo with sugar log

## 2014-03-19 ENCOUNTER — Telehealth: Payer: Self-pay | Admitting: Family Medicine

## 2014-03-19 NOTE — Telephone Encounter (Signed)
scheduled

## 2014-03-19 NOTE — Telephone Encounter (Signed)
Caller name: Selena BattenKim Relation to pt: Call back number: [phone] Pharmacy: [facility] on [address]  Reason for call:  Pt woke up with pink eye.  Wants something called in.  Advised that she would most likely need to be seen.  Wanted a message requesting a script sent back instead.

## 2014-03-19 NOTE — Telephone Encounter (Signed)
Please schedule pt for an appt

## 2014-03-19 NOTE — Telephone Encounter (Signed)
Pt needs appt for evaluation prior to meds being called in b/c more information is needed

## 2014-03-20 ENCOUNTER — Ambulatory Visit: Payer: Managed Care, Other (non HMO) | Admitting: Physician Assistant

## 2014-03-22 ENCOUNTER — Emergency Department (INDEPENDENT_AMBULATORY_CARE_PROVIDER_SITE_OTHER)
Admission: EM | Admit: 2014-03-22 | Discharge: 2014-03-22 | Disposition: A | Discharge status: Home / Self Care | Payer: Managed Care, Other (non HMO) | Source: Home / Self Care | Attending: Family Medicine | Admitting: Family Medicine

## 2014-03-22 ENCOUNTER — Encounter (HOSPITAL_COMMUNITY): Payer: Self-pay | Admitting: Emergency Medicine

## 2014-03-22 DIAGNOSIS — H109 Unspecified conjunctivitis: Secondary | ICD-10-CM

## 2014-03-22 MED ORDER — DICLOFENAC SODIUM 0.1 % OP SOLN: 1.0000 [drp] | Freq: Four times a day (QID) | OPHTHALMIC | Status: AC | PRN

## 2014-03-22 NOTE — ED Notes (Signed)
C/o bilateral eye redness and drainage.  No relief with otc eye drops.

## 2014-03-22 NOTE — Discharge Instructions (Signed)
Thank you for coming in today. Use over-the-counter Zaditor eyedrops (Ketotifen) Use over-the-counter Zyrtec (cetirizine)  Use Systane artificial tears as needed  Use the diclofenac eyedrops as needed Followup with Dr. Dione BoozeGroat if not getting better.   Conjunctivitis Conjunctivitis is commonly called "pink eye." Conjunctivitis can be caused by bacterial or viral infection, allergies, or injuries. There is usually redness of the lining of the eye, itching, discomfort, and sometimes discharge. There may be deposits of matter along the eyelids. A viral infection usually causes a watery discharge, while a bacterial infection causes a yellowish, thick discharge. Pink eye is very contagious and spreads by direct contact. You may be given antibiotic eyedrops as part of your treatment. Before using your eye medicine, remove all drainage from the eye by washing gently with warm water and cotton balls. Continue to use the medication until you have awakened 2 mornings in a row without discharge from the eye. Do not rub your eye. This increases the irritation and helps spread infection. Use separate towels from other household members. Wash your hands with soap and water before and after touching your eyes. Use cold compresses to reduce pain and sunglasses to relieve irritation from light. Do not wear contact lenses or wear eye makeup until the infection is gone. SEEK MEDICAL CARE IF:   Your symptoms are not better after 3 days of treatment.  You have increased pain or trouble seeing.  The outer eyelids become very red or swollen. Document Released: 06/25/2004 Document Revised: 08/10/2011 Document Reviewed: 05/18/2005 [facility] Patient Information 2015 [place], [place]. This information is not intended to replace advice given to you by your health care provider. Make sure you discuss any questions you have with your health care provider.
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
## 2014-03-25 ENCOUNTER — Encounter (HOSPITAL_COMMUNITY): Payer: Self-pay | Admitting: Emergency Medicine

## 2014-03-25 ENCOUNTER — Emergency Department (INDEPENDENT_AMBULATORY_CARE_PROVIDER_SITE_OTHER)
Admission: EM | Admit: 2014-03-25 | Discharge: 2014-03-25 | Disposition: A | Discharge status: Home / Self Care | Payer: Managed Care, Other (non HMO) | Source: Home / Self Care | Attending: Family Medicine | Admitting: Family Medicine

## 2014-03-25 DIAGNOSIS — H1033 Unspecified acute conjunctivitis, bilateral: Secondary | ICD-10-CM

## 2014-03-25 NOTE — Discharge Instructions (Signed)

## 2014-03-25 NOTE — ED Provider Notes (Signed)
Medical screening examination/treatment/procedure(s) were performed by resident physician or non-physician practitioner and as supervising physician I was immediately available for consultation/collaboration.   Amarea Macdowell DOUGLAS MD.   Yona Stansbury D Alaycia Eardley, MD 03/25/14 1530 

## 2014-03-25 NOTE — ED Notes (Signed)
Eye pain, redness, watery, and drainage.  Se[facility] 10/22 at [facility]. Prior to this date had called pcp and eye med called in for patient. Patient reports this issue has been going on since 10/18.  Symptoms worsening, not improving.
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
## 2014-03-29 ENCOUNTER — Ambulatory Visit: Payer: Managed Care, Other (non HMO) | Admitting: Internal Medicine

## 2014-04-02 ENCOUNTER — Encounter (HOSPITAL_COMMUNITY): Payer: Self-pay | Admitting: Emergency Medicine

## 2014-04-12 ENCOUNTER — Ambulatory Visit (INDEPENDENT_AMBULATORY_CARE_PROVIDER_SITE_OTHER): Payer: Managed Care, Other (non HMO) | Admitting: Internal Medicine

## 2014-04-12 ENCOUNTER — Encounter: Payer: Self-pay | Admitting: Internal Medicine

## 2014-04-12 VITALS — BP 112/68 | HR 86 | Temp 98.7°F | Resp 12 | Wt 257.0 lb

## 2014-04-12 DIAGNOSIS — E1165 Type 2 diabetes mellitus with hyperglycemia: Secondary | ICD-10-CM

## 2014-04-12 DIAGNOSIS — IMO0001 Reserved for inherently not codable concepts without codable children: Secondary | ICD-10-CM

## 2014-04-12 LAB — HEMOGLOBIN A1C: HEMOGLOBIN A1C: 7.9 % — AB (ref 4.6–6.5)

## 2014-04-12 MED ORDER — CANAGLIFLOZIN 100 MG PO TABS: 100.0000 mg | ORAL_TABLET | Freq: Every day | ORAL | Status: DC

## 2014-04-12 MED ORDER — LINAGLIPTIN 5 MG PO TABS
5.0000 mg | ORAL_TABLET | Freq: Every day | ORAL | Status: DC
Start: 2014-04-12 — End: 2014-06-13

## 2014-04-12 NOTE — Progress Notes (Signed)
Patient ID: Rachael BurowKimberly E Racine, female   DOB: Jul 10, 1980, 33 y.o.   MRN: [id number]  HPI: Rachael Chapman is a 33 y.o.-year-old female, returning for f/u for DM2, in 05/2013, previous GDM 2012/13, non-insulin-dependent, uncontrolled, without complications. Last visit 2 mo ago.  She had a recent adenovirus eye infection >> very painful  Last hemoglobin A1c was: Lab Results  Component Value Date   HGBA1C 7.6* 12/25/2013   HGBA1C 7.1* 08/25/2013   HGBA1C 6.9* 05/26/2013   Pt is on a regimen of: - Invokana 100 mg - added 11/2013 We stopped Metformin XR 500 mg at night b/c diarrhea. She took Glyburide during the pregnancy. Metformin >> diarrhea, gas, AP.  Pt checks her sugars 0-1x a day (few reads since last visit) - reviewed log: - am: 86-96 >> n/c >> 131, 151 - before lunch: 100 - 2h after lunch: 169 >> 133, 157 - 2h after dinner: 150-170 >> 189 >> 132, 148 - bedtime: 99 No lows; she has hypoglycemia awareness at 80.  Highest: 189 x 2 >> 157 since last visit.  Pt's meals are: - Breakfast: yoghurt/bagel/cereal/croissant - Lunch: pizza/sandwich/salad/TV dinner - Dinner: chicken, veggies, starch - Snacks: a lot - always hungry  Tried Medifast diet >> lost 30 lbs in 4 mo before pregnancy.  Since last visit, she lost another 7 lbs!  - no CKD, last BUN/creatinine:  Lab Results  Component Value Date   BUN 8 02/02/2014   CREATININE 0.84 02/02/2014   - last set of lipids: Lab Results  Component Value Date   CHOL 127 05/26/2013   HDL 26.10* 05/26/2013   LDLCALC 86 05/26/2013   TRIG 75.0 05/26/2013   CHOLHDL 5 05/26/2013   - last eye exam was in 08/2012. No DR.  - no numbness and tingling in her feet.  I reviewed pt's medications, allergies, PMH, social hx, family hx and no changes required, except as mentioned above. She increased the dose of Brillinta to 20 mg daily. She and her husband are thinking to have another baby.  ROS: Constitutional: + weight loss, + increased  appetite (at night), + increased urination, + hot flushes Eyes: no blurry vision, no xerophthalmia ENT: no sore throat, no nodules palpated in throat, no dysphagia/odynophagia, no hoarseness Cardiovascular: no CP/SOB/palpitations/leg swelling Respiratory: no cough/SOB Gastrointestinal: no N/V/D/C Musculoskeletal: no muscle aches/joint aches Skin: no rashes Neurological: no tremors/numbness/tingling/dizziness Low libido  I reviewed pt's medications, allergies, PMH, social hx, family hx and no changes required, except as mentioned above.  PE: BP 112/68 mmHg  Pulse 86  Temp(Src) 98.7 F (37.1 C) (Oral)  Resp 12  Wt 257 lb (116.574 kg)  SpO2 97% Wt Readings from Last 3 Encounters:  04/12/14 257 lb (116.574 kg)  02/06/14 264 lb (119.75 kg)  12/25/13 271 lb (122.925 kg)   Constitutional: overweight, in NAD Eyes: PERRLA, EOMI, no exophthalmos ENT: moist mucous membranes, no thyromegaly, no cervical lymphadenopathy Cardiovascular: RRR, No MRG Respiratory: CTA B Gastrointestinal: abdomen soft, NT, ND, BS+ Musculoskeletal: no deformities, strength intact in all 4 Skin: moist, warm, no rashes Neurological: no tremor with outstretched hands, DTR normal in all 4  ASSESSMENT: 1. DM2, non-insulin-dependent, uncontrolled, without complications  PLAN:  1. Patient with recent dx of DM onInvokana only. She cannot tolerate even Metformin XR b/c diarrhea. Sugars appear better, but not many checks since last visit. - I suggested to:  Patient Instructions  Please continue Invokana 100 mg in am. Please return in 3 months with your sugar log.  Please stop at the lab. - continue checking sugars at different times of the day - check once a day, rotating checks >> she did not check much since last visit >> restart checking - advised for yearly eye exams  - had flu vaccine this season - check A1c today - Return to clinic in 3 mo with sugar log   Office Visit on 04/12/2014  Component Date  Value Ref Range Status  . Hgb A1c MFr Bld 04/12/2014 7.9* 4.6 - 6.5 % Final   Glycemic Control Guidelines for People with Diabetes:Non Diabetic:  <6%Goal of Therapy: <7%Additional Action Suggested:  >8%   Hemoglobin A1c increased. I will advise the patient to start checking her sugars more consistently. However, I will suggest that we add a DPP 4 antagonist. I will try to add Tradjenta 5 mg daily.

## 2014-04-12 NOTE — Patient Instructions (Addendum)
Patient Instructions  Please continue Invokana 100 mg in am. Please return in 3 months with your sugar log.  Please stop at the lab.

## 2014-06-13 ENCOUNTER — Other Ambulatory Visit: Payer: Self-pay | Admitting: Internal Medicine

## 2014-07-13 ENCOUNTER — Ambulatory Visit: Payer: Managed Care, Other (non HMO) | Admitting: Internal Medicine

## 2014-07-19 ENCOUNTER — Ambulatory Visit (INDEPENDENT_AMBULATORY_CARE_PROVIDER_SITE_OTHER): Payer: Managed Care, Other (non HMO) | Admitting: Internal Medicine

## 2014-07-19 ENCOUNTER — Encounter: Payer: Self-pay | Admitting: Internal Medicine

## 2014-07-19 VITALS — BP 114/64 | HR 91 | Temp 97.8°F | Resp 12 | Wt 260.8 lb

## 2014-07-19 DIAGNOSIS — E119 Type 2 diabetes mellitus without complications: Secondary | ICD-10-CM

## 2014-07-19 LAB — HEMOGLOBIN A1C: Hgb A1c MFr Bld: 7.6 % — ABNORMAL HIGH (ref 4.6–6.5)

## 2014-07-19 MED ORDER — CANAGLIFLOZIN 100 MG PO TABS: 100.0000 mg | ORAL_TABLET | Freq: Every day | ORAL | Status: DC

## 2014-07-19 MED ORDER — LINAGLIPTIN 5 MG PO TABS: 5.0000 mg | ORAL_TABLET | Freq: Every day | ORAL | Status: DC

## 2014-07-19 NOTE — Progress Notes (Signed)
Patient ID: Rachael Chapman, female   DOB: Sep 17, 1980, 34 y.o.   MRN: [id number]  HPI: Rachael Chapman is a 34 y.o.-year-old female, returning for f/u for DM2, in 05/2013, previous GDM 2012/13, non-insulin-dependent, uncontrolled, without complications. Last visit 3 mo ago.  Last hemoglobin A1c was: Lab Results  Component Value Date   HGBA1C 7.9* 04/12/2014   HGBA1C 7.6* 12/25/2013   HGBA1C 7.1* 08/25/2013   Pt is on a regimen of: - Invokana 100 mg - added 11/2013 - Tradjenta 5 mg daily - added 04/2014 We stopped Metformin XR 500 mg at night b/c diarrhea. She took Glyburide during the pregnancy. Metformin >> diarrhea, gas, AP.  Pt checks her sugars 0-1x a day (few reads since last visit) - reviewed log: - am: 86-96 >> n/c >> 131, 151 >> 121-150, some 170s - 2h after b'fast: 149 - before lunch: 100 >> n/c - 2h after lunch: 169 >> 133, 157 >> n/c - before dinner: 117 - 2h after dinner: 150-170 >> 189 >> 132, 148 >> 148, 208 (pizza) - bedtime: 99 >> 111 No lows; she has hypoglycemia awareness at 80.  Highest: 189 x 2 >> 157 >> 208 since last visit.  She started to go to the gym and started to improve her diet. Less rice, pasta; more veggies. Cut out unhealthy foods.  - no CKD, last BUN/creatinine:  Lab Results  Component Value Date   BUN 8 02/02/2014   CREATININE 0.84 02/02/2014   - last set of lipids: Lab Results  Component Value Date   CHOL 127 05/26/2013   HDL 26.10* 05/26/2013   LDLCALC 86 05/26/2013   TRIG 75.0 05/26/2013   CHOLHDL 5 05/26/2013   - last eye exam was in 05/2014. No DR.  - no numbness and tingling in her feet.  I reviewed pt's medications, allergies, PMH, social hx, family hx, and changes were documented in the history of present illness. Otherwise, unchanged from my initial visit note.  ROS: Constitutional: no weight loss/gain, + fatigue, no increased urination, + poor sleep Eyes: no blurry vision, no xerophthalmia ENT: no sore throat, no  nodules palpated in throat, no dysphagia/odynophagia, no hoarseness Cardiovascular: no CP/SOB/palpitations/leg swelling Respiratory: no cough/SOB Gastrointestinal: no N/V/D/C Musculoskeletal: + muscle aches/no joint aches Skin: no rashes Neurological: no tremors/numbness/tingling/dizziness, + HA Low libido  PE: BP 114/64 mmHg  Pulse 91  Temp(Src) 97.8 F (36.6 C) (Oral)  Resp 12  Wt 260 lb 12.8 oz (118.298 kg)  SpO2 98% Wt Readings from Last 3 Encounters:  07/19/14 260 lb 12.8 oz (118.298 kg)  04/12/14 257 lb (116.574 kg)  02/06/14 264 lb (119.75 kg)   Constitutional: overweight, in NAD Eyes: PERRLA, EOMI, no exophthalmos ENT: moist mucous membranes, no thyromegaly, no cervical lymphadenopathy Cardiovascular: RRR, No MRG Respiratory: CTA B Gastrointestinal: abdomen soft, NT, ND, BS+ Musculoskeletal: no deformities, strength intact in all 4 Skin: moist, warm, no rashes Neurological: no tremor with outstretched hands, DTR normal in all 4  ASSESSMENT: 1. DM2, non-insulin-dependent, uncontrolled, without complications  PLAN:  1. Patient with fairly recent dx of DM on Invokana + Tradjenta. She cannot tolerate even Metformin XR b/c diarrhea. Sugars are still higher than target especially in am, but not many checks since last visit. She started to go to the gym and started to make positive changes in her diet >> I would like to continue the current regimen for now. We can add Cycloset or Glipizde at next visit, if needed. - I suggested to:  Patient Instructions  Please continue: - Invokana 100 mg daily in am - Tradjenta 5 mg daily in am  Please stop at the lab.  Please come back for a follow-up appointment in 3 months with your sugar log.  - given new cards for Invokana and Tradjenta - refilled both meds - continue checking sugars at different times of the day - check once a day, rotating checks >> she did not check much since last visit >> restart checking - advised for  yearly eye exams >> she is UTD - had flu vaccine this season - check A1c today - Return to clinic in 3 mo with sugar log   Office Visit on 07/19/2014  Component Date Value Ref Range Status  . Hgb A1c MFr Bld 07/19/2014 7.6* 4.6 - 6.5 % Final   Glycemic Control Guidelines for People with Diabetes:Non Diabetic:  <6%Goal of Therapy: <7%Additional Action Suggested:  >8%   HbA1c a little better.

## 2014-07-19 NOTE — Patient Instructions (Signed)
Please continue: - Invokana 100 mg daily in am - Tradjenta 5 mg daily in am  Please stop at the lab.  Please come back for a follow-up appointment in 3 months with your sugar log.

## 2014-09-18 ENCOUNTER — Other Ambulatory Visit: Payer: Self-pay | Admitting: Obstetrics and Gynecology

## 2014-09-19 LAB — CYTOLOGY - PAP

## 2014-10-17 ENCOUNTER — Ambulatory Visit: Payer: Managed Care, Other (non HMO) | Admitting: Internal Medicine

## 2014-10-25 ENCOUNTER — Encounter: Payer: Self-pay | Admitting: Internal Medicine

## 2014-10-25 ENCOUNTER — Ambulatory Visit (INDEPENDENT_AMBULATORY_CARE_PROVIDER_SITE_OTHER): Payer: Managed Care, Other (non HMO) | Admitting: Internal Medicine

## 2014-10-25 VITALS — BP 114/68 | HR 75 | Temp 98.6°F | Resp 14 | Wt 259.0 lb

## 2014-10-25 DIAGNOSIS — E1165 Type 2 diabetes mellitus with hyperglycemia: Secondary | ICD-10-CM

## 2014-10-25 MED ORDER — CANAGLIFLOZIN 100 MG PO TABS: 100.0000 mg | ORAL_TABLET | Freq: Every day | ORAL | Status: AC

## 2014-10-25 MED ORDER — LINAGLIPTIN 5 MG PO TABS: 5.0000 mg | ORAL_TABLET | Freq: Every day | ORAL | Status: AC

## 2014-10-25 NOTE — Patient Instructions (Signed)
Please continue: - Invokana 100 mg daily in am - Tradjenta 5 mg daily in am  You will need a HbA1c, microalbumin/creatinine ratio and a cholesterol panel.

## 2014-10-25 NOTE — Progress Notes (Signed)
Patient ID: Rachael BurowKimberly E Angelini, female   DOB: Sep 29, 1980, 34 y.o.   MRN: [id number]  HPI: Rachael Chapman is a 34 y.o.-year-old female, returning for f/u for DM2, in 05/2013, previous GDM 2012/13, non-insulin-dependent, uncontrolled, without complications. Last visit 3 mo ago.  [place].  Last hemoglobin A1c was: Lab Results  Component Value Date   HGBA1C 7.6* 07/19/2014   HGBA1C 7.9* 04/12/2014   HGBA1C 7.6* 12/25/2013   Pt is on a regimen of: - Invokana 100 mg - added 11/2013 - Tradjenta 5 mg daily - added 04/2014 We stopped Metformin XR 500 mg at night b/c diarrhea. She took Glyburide during the pregnancy. Metformin >> diarrhea, gas, AP.  Pt checks her sugars 0-1x a day - reviewed log: - am: 86-96 >> n/c >> 131, 151 >> 121-150, some 170s >> 126-139 - 2h after b'fast: 149 >> 157-169 - before lunch: 100 >> n/c >> 97-147 - 2h after lunch: 169 >> 133, 157 >> n/c >> 131-156 - before dinner: 117 >> 97-138 - 2h after dinner: 150-170 >> 189 >> 132, 148 >> 148, 208 (pizza) >> 108-137 - bedtime: 99 >> 111 >> 103-106, 147 No lows; she has hypoglycemia awareness at 80.  Highest: 189 x 2 >> 157 >> 208 >> 156 since last visit.  - no CKD, last BUN/creatinine:  Lab Results  Component Value Date   BUN 8 02/02/2014   CREATININE 0.84 02/02/2014   - last set of lipids: Lab Results  Component Value Date   CHOL 127 05/26/2013   HDL 26.10* 05/26/2013   LDLCALC 86 05/26/2013   TRIG 75.0 05/26/2013   CHOLHDL 5 05/26/2013   - last eye exam was in 05/2014. No DR.  - no numbness and tingling in her feet.  I reviewed pt's medications, allergies, PMH, social hx, family hx, and changes were documented in the history of present illness. Otherwise, unchanged from my initial visit note.  ROS: Constitutional: + weight gain, + fatigue, no increased urination, + poor sleep, + increased urination Eyes: no blurry vision, no xerophthalmia ENT: no  sore throat, no nodules palpated in throat, no dysphagia/odynophagia, no hoarseness Cardiovascular: + CP/no SOB/palpitations/leg swelling Respiratory: no cough/SOB Gastrointestinal: no N/V/D/C Musculoskeletal: + muscle aches/no joint aches Skin: no rashes, + itching Neurological: no tremors/numbness/tingling/dizziness Low libido  PE: BP 114/68 mmHg  Pulse 75  Temp(Src) 98.6 F (37 C) (Oral)  Resp 14  Wt 259 lb (117.482 kg)  SpO2 98% Wt Readings from Last 3 Encounters:  10/25/14 259 lb (117.482 kg)  07/19/14 260 lb 12.8 oz (118.298 kg)  04/12/14 257 lb (116.574 kg)   Constitutional: overweight, in NAD Eyes: PERRLA, EOMI, no exophthalmos ENT: moist mucous membranes, no thyromegaly, no cervical lymphadenopathy Cardiovascular: RRR, No MRG Respiratory: CTA B Gastrointestinal: abdomen soft, NT, ND, BS+ Musculoskeletal: no deformities, strength intact in all 4 Skin: moist, warm, no rashes Neurological: no tremor with outstretched hands, DTR normal in all 4  ASSESSMENT: 1. DM2, non-insulin-dependent, uncontrolled, without complications  PLAN:  1. Patient with fairly recent dx of DM on Invokana + Tradjenta. She is doing well on this regimen >> I would like to continue the current regimen for now.  - I suggested to:  Patient Instructions  Please continue: - Invokana 100 mg daily in am - Tradjenta 5 mg daily in am  You will need a HbA1c, microalbumin/creatinine ratio and a cholesterol panel.  - continue checking sugars at different times  of the day - check once a day, rotating checks - advised for yearly eye exams >> she is UTD - refilled her Rx's >> printed, per her pref. - she would prefer to have labs checked in [place] as she is moving tomorrow

## 2015-01-04 ENCOUNTER — Other Ambulatory Visit: Payer: Self-pay

## 2015-01-04 ENCOUNTER — Emergency Department: Payer: BLUE CROSS/BLUE SHIELD

## 2015-01-04 ENCOUNTER — Emergency Department
Admission: EM | Admit: 2015-01-04 | Discharge: 2015-01-04 | Disposition: A | Discharge status: Home / Self Care | Payer: BLUE CROSS/BLUE SHIELD | Attending: Emergency Medicine | Admitting: Emergency Medicine

## 2015-01-04 DIAGNOSIS — M25521 Pain in right elbow: Secondary | ICD-10-CM | POA: Insufficient documentation

## 2015-01-04 DIAGNOSIS — E119 Type 2 diabetes mellitus without complications: Secondary | ICD-10-CM | POA: Insufficient documentation

## 2015-01-04 DIAGNOSIS — M546 Pain in thoracic spine: Secondary | ICD-10-CM | POA: Insufficient documentation

## 2015-01-04 DIAGNOSIS — Y92008 Other place in unspecified non-institutional (private) residence as the place of occurrence of the external cause: Secondary | ICD-10-CM | POA: Insufficient documentation

## 2015-01-04 DIAGNOSIS — W109XXA Fall (on) (from) unspecified stairs and steps, initial encounter: Secondary | ICD-10-CM | POA: Insufficient documentation

## 2015-01-04 DIAGNOSIS — M25511 Pain in right shoulder: Secondary | ICD-10-CM | POA: Insufficient documentation

## 2015-01-04 HISTORY — DX: Anxiety disorder, unspecified: F41.9

## 2015-01-04 HISTORY — DX: Depression, unspecified: F32.A

## 2015-01-04 HISTORY — DX: Type 2 diabetes mellitus without complications: E11.9

## 2015-01-04 MED ORDER — IBUPROFEN 600 MG PO TABS
600.0000 mg | ORAL_TABLET | Freq: Once | ORAL | Status: AC
Start: 2015-01-04 — End: 2015-01-04
  Administered 2015-01-04: 600 mg via ORAL
  Filled 2015-01-04: qty 1

## 2015-01-04 MED ORDER — IBUPROFEN 600 MG PO TABS
600.0000 mg | ORAL_TABLET | Freq: Four times a day (QID) | ORAL | 0 refills | Status: AC | PRN
Start: 2015-01-04 — End: 2015-01-09
  Filled 2015-01-04: qty 20, 5d supply, fill #0

## 2015-01-04 MED ORDER — CYCLOBENZAPRINE HCL 10 MG PO TABS
10.0000 mg | ORAL_TABLET | Freq: Three times a day (TID) | ORAL | 0 refills | Status: AC | PRN
Start: 2015-01-04 — End: 2015-01-19
  Filled 2015-01-04: qty 15, 5d supply, fill #0

## 2015-01-04 MED ORDER — OXYCODONE-ACETAMINOPHEN 5-325 MG PO TABS
1.0000 | ORAL_TABLET | Freq: Once | ORAL | Status: AC
Start: 2015-01-04 — End: 2015-01-04
  Administered 2015-01-04: 1 via ORAL
  Filled 2015-01-04: qty 1

## 2015-01-04 MED ORDER — OXYCODONE-ACETAMINOPHEN 5-325 MG PO TABS
1.0000 | ORAL_TABLET | Freq: Four times a day (QID) | ORAL | 0 refills | Status: DC | PRN
Start: 2015-01-04 — End: 2017-09-16
  Filled 2015-01-04: qty 15, 4d supply, fill #0

## 2015-01-04 NOTE — ED Notes (Signed)
The patient was seen and examined by PA or Fellow; plan of care was discussed with me. I have also seen and examined the patient personally and concur with PA/NP plan of care.     Latanya Presser  8:43 AM  01/04/2015          Latanya Presser, MD  01/04/15 [phone]

## 2015-01-04 NOTE — Discharge Instructions (Signed)
VICODIN/PERCOCET/NORCO (PAIN KILLER) AND FLEXERIL/VALIUM( MUSCLE RELAXER) : DO NOT TAKE WHILE WORKING OR DRIVING OR USING MACHINERY.   THESE MEDICATION CAUSE DROWSINESS. TAKE THREE HOURS APART TO PREVENT SEVERE DROWSINESS    Back Pain NOS    You have been seen for back pain.    Back pain can happen anywhere from the neck down to the low back. Back pain has many different causes. Some of the more common are: Bone pain, muscle strain, muscle spasm, pain from overuse, and pinched nerves. Other problems can cause what feels like back pain. But the pain is really coming from another organ. A kidney infection can cause lower back pain.    Your doctor did not find any pain over the bones in your back (even though you might have pain in the muscles of the back). This means it is very unlikely that you have a broken bone (fracture) in your back. Your doctor did not think it was necessary to take an x-ray.    The doctor still does not know the exact cause of your pain. Your problem does not seem to be from a dangerous cause. It is OK for you to go home today.    Some things you can try to help your back feel better are:   Apply a warm damp washcloth to the back where you have pain for 20 minutes at a time, at least 4 times per day.   Have someone massage the sore parts of your back.   Don t do any heavy lifting or bending. You can go back to normal daily activities if they don t make the pain worse.   You can use anti-inflammatory pain medicine for your pain. This could be Ibuprofen (Advil or Motrin). You can buy these at most stores. Follow the directions on the package.    This pain may last for the next few days. If your pain gets better, you probably do not need to see a doctor. However, if your symptoms get worse or you have new symptoms, you should return here or go to the nearest Emergency Department.    Call your doctor or go to the nearest Emergency Department if you your pain does not improve within 4  weeks or your pain is bad enough to seriously limit your normal activities.    YOU SHOULD SEEK MEDICAL ATTENTION IMMEDIATELY, EITHER HERE OR AT THE NEAREST EMERGENCY DEPARTMENT, IF ANY OF THE FOLLOWING OCCURS:   You think the pain is coming from somewhere other than your back. This can include chest pain. This is sometimes from angina (heart pains) or other dangerous causes.   You have shortness of breath, sweating, chest pain (or pressure, heaviness, indigestion, etc).   You have abdominal (belly) pain that goes through to your back.   Your arms and legs tingle or get numb (lose feeling).   Your arms or legs are weak.   You lose control of your bladder or bowels. If this were to happen, it may cause you to wet or soil yourself.   You have problems urinating (peeing).   You have fever (temperature higher than 100.56F / 38C).   Your pain gets worse.                Kara Boyd  [id number]  [id number]  [id number]  01/04/2015    Discharge Instructions    As always, you are the most important factor in your recovery.  Please follow these instructions carefully.  If  you have problems that we have not discussed, CALL OR VISIT YOUR DOCTOR RIGHT AWAY.     If you can't reach your doctor, return to the emergency department.    Kara Boyd understand the written and discussed instructions.  My questions have been answered.  Kara acknowledge receipt of these instructions.     Patient or responsible person:         Patient's Signature               Physician or Nurse          Head Injury, NOS    You have been seen for a head injury.    A head injury can happen after something strikes the head or as a result of a fall or other injury. Head injuries can range from mild injuries to more severe injuries. The more severe injuries can result in broken bones or injury to the brain itself. Mild head injuries will show no abnormalities if a CT (CAT) scan of the brain is done.     Although you had an injury to your  head, you do not seem to have a serious brain injury.     Head injury symptoms can last from hours to months. The time depends on how bad the injury was. It also depends on whether you ve had a concussion in the past. Some problems with a concussion can include: Sleep, memory and concentration problems. They also include chronic (ongoing) headaches and sensitivity to light. These symptoms can happen soon after the concussion. They can also develop slowly over time. They can last up to a year. When this happens, it is called "post concussion syndrome."    If you develop "post-concussive syndrome," you should follow up with your doctor. Your doctor can care for you or provide a referral to a head-injury specialist.    Treatment includes observation at home and pain medicine like acetaminophen (Tylenol) or ibuprofen (Advil or Motrin). Prescription pain medicine is probably not needed.    You might have a mild headache for a few days.    Over the next 24 hours:   Stay with family or friends who can watch your behavior.   Avoid alcohol or drugs.    YOU SHOULD SEEK MEDICAL ATTENTION IMMEDIATELY, EITHER HERE OR AT THE NEAREST EMERGENCY DEPARTMENT, IF ANY OF THE FOLLOWING OCCURS:   Your headache gets worse.   Your headache pain changes.   You have fever (temperature higher than 100.4F / 38C), neck pain, vision changes, difficulty walking or change of behavior.   You feel numbness, tingling, weakness in your arms or legs.   You faint.   Your vision changes.   You vomit often or cannot keep medicine down.   You are confused or have difficulty waking from sleep.

## 2015-01-04 NOTE — ED Provider Notes (Signed)
[facility] EMERGENCY DEPARTMENT APP H&P         CLINICAL SUMMARY          Diagnosis:    .     Final diagnoses:   Thoracic back pain, unspecified back pain laterality         MDM Notes:            Disposition:         Discharge         Discharge Prescriptions     Medication Sig Dispense Auth. Provider    ibuprofen (ADVIL,MOTRIN) 600 MG tablet Take 1 tablet (600 mg total) by mouth every 6 (six) hours as needed for Pain or Fever. 20 tablet Antwonette Feliz, [place], [place]    oxyCODONE-acetaminophen (PERCOCET) 5-325 MG per tablet Take 1 tablet by mouth every 6 (six) hours as needed for Pain. 15 tablet Vienne Corcoran, [place], [place]    cyclobenzaprine (FLEXERIL) 10 MG tablet Take 1 tablet (10 mg total) by mouth 3 (three) times daily as needed for Muscle spasms. 15 tablet Edmonia Caprio, [place]                    CLINICAL INFORMATION        HPI:      Chief Complaint: Fall  .      Context: home  Location: R shoulder pain  Duration: pta  Quality: throbbing  Timing: constant  Maximum Severity: 8/10  Modifying Factors: rest      Kara Boyd is a 34 y.o. female who presents with h/o Depression, DM, anxiety c/o R shoulder pain s/p fall x pta. Pt was at home, wearing socks when she slipped going down the stairs and slid down 8 steps and landed on back, no hi, no loc, no ha, no dizziness, no vision changes, no n/v, +R side neck pain, R shoulder pain, +R scapula and R elbow pain,   +cp and sob, no abd pain,      History obtained from: Patient          ROS:      Positive and negative ROS elements as per HPI.  All other systems reviewed and negative.      Physical Exam:      Pulse 78  BP 115/57 mmHg  Resp 18  SpO2 100 %  Temp 98.3 F (36.8 C)    Constitutional: Vital signs reviewed.   Head: Normocephalic, atraumatic  Eyes: No conjunctival injection. No discharge. EOM are normal.   ENT: Mucous membranes moist  Neck: Normal range of motion. Trachea midline. Neck supple. No midline cervical ttp. R paraspinal ttp.   Respiratory/Chest: clear to  auscultation. No work of breathing. No tachypnea..no ecchymosis/sts, nontender   Cardiovascular: Normal rate and rhythm. No murmur -----  Abdomen: Soft and nontender in all quadrants. No guarding or rebound. No masses or hepatosplenomegaly. -----.  Upper Extremities:No edema or cyanosis, R shoulder mild ttp. FROM of shoulder, sensation intact, radial pulse 2+,   R elbow medial aspect with mild ttp. FROM,   R scapula mild ttp, no crepitus or step off, no ecchymosis/sts  Lower Extremities:No edema or cyanosis  Musculoskeletal: Normal range of motion.  No midline TLS  Neurological: Alert Normal and symmetric motor tone by observation. Speech normal. Memory Normal. No facial assymetry.  Skin:  Warm and dry. No rashes noted.   Psychiatric: Normal affect and normal concentration for age            PAST HISTORY  Primary Care Provider: Erline Hau, MD        PMH/PSH:    .     Past Medical History   Diagnosis Date   . Diabetes mellitus    . Depression    . Anxiety        She has past surgical history that includes Cholecystectomy and Cesarean section.      Social/Family History:      She reports that she has been smoking.  She does not have any smokeless tobacco history on file. She reports that she drinks alcohol. She reports that she does not use illicit drugs.    History reviewed. No pertinent family history.      Listed Medications on Arrival:    .     Discharge Medication List as of 01/04/2015 11:59 AM      CONTINUE these medications which have NOT CHANGED    Details   busPIRone (BUSPAR) 30 MG tablet Take 30 mg by mouth 2 (two) times daily., Until Discontinued, Historical Med      canagliflozin (INVOKANA) 100 MG Tab tablet Take 100 mg by mouth daily., Until Discontinued, Historical Med      Linagliptin (TRADJENTA PO) Take by mouth., Until Discontinued, Historical Med      LORazepam (ATIVAN) 1 MG tablet Take 1 mg by mouth every 6 (six) hours as needed for Anxiety., Until Discontinued, Historical Med       Vortioxetine HBr (TRINTELLIX) 20 MG Tab Take by mouth., Until Discontinued, Historical Med            Allergies: She is allergic to codeine.            VISIT INFORMATION        Clinical Course in the ED:    10:58 AM delay in reading xray shoulder/scapula, cxr and elbow xray already read (9:30)        Medications Given in the ED:    .     ED Medication Orders     Start Ordered     Status Ordering Provider    01/04/15 1051 01/04/15 1050  oxyCODONE-acetaminophen (PERCOCET) 5-325 MG per tablet 1 tablet   Once     Route: Oral  Ordered Dose: 1 tablet     Last MAR action:  Given Edmonia Caprio    01/04/15 0916 01/04/15 0915  ibuprofen (ADVIL,MOTRIN) tablet 600 mg   Once     Route: Oral  Ordered Dose: 600 mg     Last MAR action:  Given Ciaran Begay            Procedures:            Interpretations:      O2 sat-           saturation: 100 %; Oxygen use: room air; Interpretation: Normal    Radiology -     interpreted by me with the following observations: neg shoulder fx/dislocation  EKG -             interpreted by me:   DDx-              Initial differential diagnosis included: contusion, fx, dislocation               RESULTS        Lab Results:      Results     ** No results found for the last 24 hours. **  Radiology Results:      Shoulder Right 2+ Views   Final Result      1. No fracture.   2. Elbow soft tissue swelling.       Mickel Duhamel, MD    01/04/2015 11:10 AM         Scapula Right   Final Result      1. No fracture.   2. Elbow soft tissue swelling.       Mickel Duhamel, MD    01/04/2015 11:10 AM         Chest 2 Views   Final Result      Normal study.       Mickel Duhamel, MD    01/04/2015 9:22 AM         Elbow Right AP Lateral and Obliques   Final Result      1. No fracture.   2. Oval soft tissue swelling. Unremarkable.      Mickel Duhamel, MD    01/04/2015 9:21 AM                     Scribe Attestation:      No scribe involved in the care of this patient           Edmonia Caprio, [place]  01/04/15 1059    Shelby Mattocks [place],  [place]  01/04/15 1444

## 2016-05-27 ENCOUNTER — Ambulatory Visit (INDEPENDENT_AMBULATORY_CARE_PROVIDER_SITE_OTHER): Payer: Self-pay

## 2016-05-27 ENCOUNTER — Other Ambulatory Visit (INDEPENDENT_AMBULATORY_CARE_PROVIDER_SITE_OTHER): Payer: Self-pay | Admitting: Nurse Practitioner

## 2016-05-27 DIAGNOSIS — M79675 Pain in left toe(s): Secondary | ICD-10-CM

## 2016-05-27 DIAGNOSIS — M25511 Pain in right shoulder: Secondary | ICD-10-CM

## 2016-06-08 ENCOUNTER — Emergency Department: Payer: Self-pay

## 2016-06-08 ENCOUNTER — Emergency Department
Admission: EM | Admit: 2016-06-08 | Discharge: 2016-06-08 | Disposition: A | Discharge status: Home / Self Care | Payer: Commercial Managed Care - POS | Attending: Emergency Medicine | Admitting: Emergency Medicine

## 2016-06-08 ENCOUNTER — Emergency Department: Payer: Commercial Managed Care - POS

## 2016-06-08 DIAGNOSIS — S5002XA Contusion of left elbow, initial encounter: Secondary | ICD-10-CM | POA: Insufficient documentation

## 2016-06-08 DIAGNOSIS — W000XXA Fall on same level due to ice and snow, initial encounter: Secondary | ICD-10-CM | POA: Insufficient documentation

## 2016-06-08 DIAGNOSIS — Y9301 Activity, walking, marching and hiking: Secondary | ICD-10-CM | POA: Insufficient documentation

## 2016-06-08 DIAGNOSIS — E119 Type 2 diabetes mellitus without complications: Secondary | ICD-10-CM | POA: Insufficient documentation

## 2016-06-08 DIAGNOSIS — F17200 Nicotine dependence, unspecified, uncomplicated: Secondary | ICD-10-CM | POA: Insufficient documentation

## 2016-06-08 DIAGNOSIS — Z7984 Long term (current) use of oral hypoglycemic drugs: Secondary | ICD-10-CM | POA: Insufficient documentation

## 2016-06-08 MED ORDER — HYDROCODONE-ACETAMINOPHEN 5-325 MG PO TABS
1.0000 | ORAL_TABLET | Freq: Once | ORAL | Status: AC
Start: 2016-06-08 — End: 2016-06-08
  Administered 2016-06-08: 1 via ORAL
  Filled 2016-06-08: qty 1

## 2016-06-08 NOTE — Discharge Instructions (Signed)
Take ibuprofen 600 mg every 6 hours as needed for pain. You may also take Tylenol 650-1,000 mg every 6 hours. Do not exceed 3,000 mg of Tylenol per day.       Radial Head Fracture    You have a suspected fracture of the head (top) of the radius bone. This bone is in your forearm. A definite fracture is not seen on x-ray. However, swelling within the joint capsule is very suspicious for a hidden fracture. This is also called an "occult" fracture.    A fracture means the same thing as a "broken bone." In general, fractures heal in about 6-8 weeks. Over time, the broken area gets stronger than the area around it. The doctor may choose to splint this type of fracture. The doctor may choose to use a sling only. This will depend on how severe the injury is. Most fractures can be managed with a splint, cast or sling. However, some need surgery to get the best possible alignment (position) of the broken bones and fracture correction. The orthopedic (bone) doctor will decide if surgery is needed.    Generally, fracture treatment includes pain medicine and a splint/cast to reduce movement. Treatment also includes Resting, Icing, Compressing and Elevating the injured area. Remember this as "RICE."   REST: Limit the use of the injured body part.   ICE: By applying ice to the affected area, swelling and pain can be reduced. Place some ice cubes in a re-sealable (Ziploc) bag and add some water. Put a thin washcloth between the bag and the skin. Apply the ice bag to the area for at least 20 minutes. Do this at least 4 times per day. Using the ice for longer times and more frequently is OK. NEVER APPLY ICE DIRECTLY TO THE SKIN.   COMPRESS: Compression means to apply pressure around the injured area such as with a splint, cast or an ACE bandage. Compression decreases swelling and improves comfort. Compression should be tight enough to relieve swelling but not so tight as to decrease circulation. Increasing pain, numbness,  tingling, or change in skin color, are all signs of decreased circulation.   ELEVATE: Elevate the injured part. A fractured arm can be elevated by placing the arm in a sling while awake and propped up on pillows while lying down.    You have been given a SPLINT for your fracture. This is to reduce pain and keep the injured area immobilized (still). Use the splint until you follow up with the referral orthopedic (bone) doctor.    Use the following SPLINT CARE instructions. Do the following many times throughout the day:   Check capillary refill (circulation) in the nail beds. Press on the nail bed and then release. It should turn white when you press on it. It should then get pink again in less than 2 seconds after you let go.   Watch to see if the area beyond the splint gets swollen.   Sometimes the splint is too tight. When this happens, the skin of the hand/fingers is very cold, pale or numb to the touch. The wrap holding the splint in place can be loosened. You can come back here or go to the nearest Emergency Department to have it adjusted.    YOU SHOULD SEEK MEDICAL ATTENTION IMMEDIATELY, EITHER HERE OR AT THE NEAREST EMERGENCY DEPARTMENT, IF ANY OF THE FOLLOWING OCCURS:   Severe increase in pain or swelling in the injured area.   New numbness or tingling in or  below the injured area.   You develop a cold, pale arm that seems to have blood supply problems.   Any of the problems described in the SPLINT CARE instructions develop.

## 2016-06-08 NOTE — ED Triage Notes (Signed)
Kara Boyd is a 36 y.o. female presents with right elbow pain after she had a fall today 1815.

## 2016-06-08 NOTE — ED Provider Notes (Signed)
EMERGENCY DEPARTMENT HISTORY AND PHYSICAL EXAM     Physician/Midlevel provider first contact with patient: 06/08/16 2046         Date: 06/08/2016  Patient Name: Kara Boyd    History of Presenting Illness     Chief Complaint   Patient presents with   . Joint Swelling       History Provided By: Patient    Chief Complaint: Elbow injury  Onset: around 6:15 PM   Timing: constant  Location: L elbow  Quality:   Severity: severe  Modifying Factors: worse with flexion/extension  Associated Symptoms: low back pain. Denies head injury    Additional History: Kara Boyd is a 36 y.o. female with h/o DM who presents with injury to her left elbow which occurred this evening when patient was walking outside and slipped on ice and fell backwards, striking her left elbow. Reports mild soreness to her low back. Denies hitting head. States she initially had numbness to left thumb which resolved. Minimal improvement in pain to elbow with ibuprofen 400 mg. Denies LE weakness, loss of bowel or bladder control, urinary retention, saddle anesthesia.     PCP: Erline Hau, MD      No current facility-administered medications for this encounter.      Current Outpatient Prescriptions   Medication Sig Dispense Refill   . buPROPion XL (WELLBUTRIN XL) 300 MG 24 hr tablet Take 300 mg by mouth daily.     . Dulaglutide (TRULICITY SC) Inject into the skin.     Marland Kitchen escitalopram (LEXAPRO) 5 MG tablet Take 5 mg by mouth daily.     . busPIRone (BUSPAR) 30 MG tablet Take 30 mg by mouth 2 (two) times daily.     . canagliflozin (INVOKANA) 100 MG Tab tablet Take 100 mg by mouth daily.     . Linagliptin (TRADJENTA PO) Take by mouth.     Marland Kitchen LORazepam (ATIVAN) 1 MG tablet Take 1 mg by mouth every 6 (six) hours as needed for Anxiety.     Marland Kitchen oxyCODONE-acetaminophen (PERCOCET) 5-325 MG per tablet Take 1 tablet by mouth every 6 (six) hours as needed for Pain. 15 tablet 0   . Vortioxetine HBr (TRINTELLIX) 20 MG Tab Take by mouth.         Past History      Past Medical History:  Past Medical History:   Diagnosis Date   . Anxiety    . Depression    . Diabetes mellitus        Past Surgical History:  Past Surgical History:   Procedure Laterality Date   . CESAREAN SECTION     . CHOLECYSTECTOMY         Family History:  History reviewed. No pertinent family history.    Social History:  Social History   Substance Use Topics   . Smoking status: Current Some Day Smoker   . Smokeless tobacco: Current User   . Alcohol use Yes       Allergies:  Allergies   Allergen Reactions   . Codeine        Review of Systems     Review of Systems   HENT:        (-) head injury   Musculoskeletal: Positive for back pain and joint pain (L elbow).   Neurological: Positive for sensory change (L thumb, resolved). Negative for tingling and loss of consciousness.   Endo/Heme/Allergies:        (+) codeine allergy  Physical Exam   BP 125/80   Pulse 89   Temp 97.6 F (36.4 C) (Oral)   Resp 18   Ht 5\' 9"  (1.753 m)   Wt 110.2 kg   SpO2 98%   BMI 35.88 kg/m     Physical Exam   Constitutional: She is oriented to person, place, and time. She appears well-developed and well-nourished.  Non-toxic appearance. No distress.   Cardiovascular:   Pulses:       Radial pulses are 2+ on the right side, and 2+ on the left side.        Posterior tibial pulses are 2+ on the right side, and 2+ on the left side.   Musculoskeletal:        Left shoulder: She exhibits normal range of motion, no tenderness, no swelling and no crepitus.        Right elbow: She exhibits normal range of motion and no swelling. No tenderness found.        Left elbow: She exhibits decreased range of motion and swelling (mild posterior). She exhibits no deformity. Tenderness found. Medial epicondyle and olecranon process tenderness noted. No radial head and no lateral epicondyle tenderness noted.        Left wrist: She exhibits normal range of motion, no bony tenderness, no swelling, no crepitus and no deformity.   Grip strength  5/5  No midline tenderness or step off   Neurological: She is alert and oriented to person, place, and time. She has normal strength. No sensory deficit. GCS eye subscore is 4. GCS verbal subscore is 5. GCS motor subscore is 6.   Nursing note and vitals reviewed.      Diagnostic Study Results     Labs -     Results     ** No results found for the last 24 hours. **          Radiologic Studies -   Radiology Results (24 Hour)     Procedure Component Value Units Date/Time    Elbow Left AP Lateral and Obliques [[id number]] Collected:  06/08/16 2155    Order Status:  Completed Updated:  06/08/16 2201    Narrative:       XR ELBOW LEFT AP LATERAL AND OBLIQUES  CLINICAL INDICATION: fell directly on elbow, tender olecranon r/o fx    COMPARISON: None available.    FINDINGS:  4 views  were obtained. The AP view is suboptimal since the  patient was unable to extend. A fat pad sign is present. No fracture is  identified The joint alignment is well maintained.              Impression:        Study is limited due to patient's inability to extend.  Positive fat pad sign present. No fracture seen however underlying  occult fracture is suspected. Follow-up recommended.    Heron Nay, MD   06/08/2016 9:57 PM      .      Medical Decision Making   I am the first provider for this patient.    I reviewed the vital signs, available nursing notes, past medical history, past surgical history, family history and social history.    Vital Signs-Reviewed the patient's vital signs.     Patient Vitals for the past 12 hrs:   BP Temp Pulse Resp   06/08/16 2039 125/80 97.6 F (36.4 C) 89 18       Pulse Oximetry Analysis - Normal 98% on  RA    Old Medical Records: Old medical records.  Nursing notes.     ED Course:     Provider Notes: 36 y/o F with elbow injury from slip and fall on ice. No discrete fracture on xray however (+) fat pad. Will treat in case of occult fracture with splint, ortho f/u. Patient has Norco at home which she states she can  take for pain. Case d/w Dr Roxan Hockey.     Procedures:    ------------------- PROCEDURE: SPLINT APPLICATION  -------------------    Applied by Tech, supervised by emergency provider.   Location: L elbow  Procedure: The area of the splint was appropriately positioned.  A posterior long arm was applied.   Post-procedure: Good position.  Neurovascular status remains intact.  Patient tolerated the procedure well with no immediate complications      Core Measures:    Critical Care Time:     Diagnosis     Clinical Impression:   1. Contusion of left elbow, initial encounter        _______________________________                Clarisa Schools, PA  06/09/16 1610       Ashley Jacobs, MD  06/09/16 2028

## 2016-06-11 ENCOUNTER — Encounter (INDEPENDENT_AMBULATORY_CARE_PROVIDER_SITE_OTHER): Payer: Self-pay | Admitting: Orthopaedic Surgery

## 2016-06-11 ENCOUNTER — Ambulatory Visit (INDEPENDENT_AMBULATORY_CARE_PROVIDER_SITE_OTHER): Payer: Commercial Managed Care - POS | Admitting: Orthopaedic Surgery

## 2016-06-11 VITALS — BP 122/61 | HR 66 | Ht 69.0 in | Wt 243.0 lb

## 2016-06-11 DIAGNOSIS — S52125A Nondisplaced fracture of head of left radius, initial encounter for closed fracture: Secondary | ICD-10-CM

## 2016-06-11 MED ORDER — TRAMADOL HCL 50 MG PO TABS
100.0000 mg | ORAL_TABLET | Freq: Four times a day (QID) | ORAL | 0 refills | Status: AC | PRN
Start: 2016-06-11 — End: ?

## 2016-06-11 NOTE — Progress Notes (Signed)
HISTORY:  Kara Boyd presents today with a chief complaint of left elbow injury sustained 3 days ago.  This happened when she slipped on ice and fell onto her elbow  Today, the pain is located elbow  She rates the  pain 6/10  It is described as sharp  It is better with rest and worse with movement  Previous Treatment: she went to the ER where xrays were negative for fracture but suspected an occult radial head fracture    She works as Scientist, water quality.      Past Medical History:   Diagnosis Date   . Anxiety    . Depression    . Diabetes mellitus      Past Surgical History:   Procedure Laterality Date   . CESAREAN SECTION     . CHOLECYSTECTOMY       Current Outpatient Prescriptions on File Prior to Visit   Medication Sig Dispense Refill   . buPROPion XL (WELLBUTRIN XL) 300 MG 24 hr tablet Take 300 mg by mouth daily.     . busPIRone (BUSPAR) 30 MG tablet Take 30 mg by mouth 2 (two) times daily.     . canagliflozin (INVOKANA) 100 MG Tab tablet Take 100 mg by mouth daily.     . Dulaglutide (TRULICITY SC) Inject into the skin.     Marland Kitchen escitalopram (LEXAPRO) 5 MG tablet Take 5 mg by mouth daily.     . Linagliptin (TRADJENTA PO) Take by mouth.     Marland Kitchen LORazepam (ATIVAN) 1 MG tablet Take 1 mg by mouth every 6 (six) hours as needed for Anxiety.     Marland Kitchen oxyCODONE-acetaminophen (PERCOCET) 5-325 MG per tablet Take 1 tablet by mouth every 6 (six) hours as needed for Pain. 15 tablet 0   . Vortioxetine HBr (TRINTELLIX) 20 MG Tab Take by mouth.       No current facility-administered medications on file prior to visit.      Allergies   Allergen Reactions   . Codeine      Social History     Social History   . Marital status: Married     Spouse name: N/A   . Number of children: N/A   . Years of education: N/A     Occupational History   . Not on file.     Social History Main Topics   . Smoking status: Current Some Day Smoker   . Smokeless tobacco: Current User   . Alcohol use Yes   . Drug use: Yes     Types: Marijuana   . Sexual activity: Not  on file     Other Topics Concern   . Not on file     Social History Narrative   . No narrative on file     No family history on file.    REVIEW OF SYSTEMS: History obtained from chart review and the patient.  General: negative for chills, fever or night sweats  Hematological and Lymphatic: negative for bleeding problems, jaundice or pallor  Endocrine: negative for hot flashes, malaise/lethargy or mood swings  Respiratory: negative for cough, shortness of breath or wheezing  Cardiovascular: negative for chest pain, LOC or SOB  Gastrointestinal: negative for abdominal pain, appetite loss or nausea/vomiting  Musculoskeletal: positive for elbow pain as listed in the history of present illness.  All other systems negative    PHYSICAL EXAMINATION    There were no vitals filed for this visit.    General: The patient is a  well appearing female who appears the stated age and is in no acute distress  Psychiatry: Normal affect  Neuro: Cranial nerves 2-12 grossly intact  Eyes: No sceleral icterus  Neck: Supple, no lymphadenopathy  Cardiovascular: Normal rate and rhythem by pulse exam  Respiratory: Normal excursion of the chest wall  Abdomen: Soft, non distended  Skin: Intact, no lesions noted  Extremities:  Left Elbow (unaffected)  No gross deformity  No tenderness over the lateral epicondyle, olecranon, medial epicondyle.  Elbow ROM 0-140  Full finger flexion  Radial Pulse 2+  SILT    Right Elbow (affected)  No gross deformity  There is no tenderness over the lateral epicondyle  There is no tenderness over the olecranon  There is no tenderness over the medial epicondyle  There is tenderness over the radial head  There is no pain with resisted wrist extension  There is varus and valgus stability at 0 and 30 degrees  ROM 0-140  Supination: 70  Pronation: 90  Full finger flexion  Radial pulse 2+      IMAGING STUDIES  Left elbow x-rays, AP, lateral and oblique views obtained emergency department were personally reviewed by me.   These imaging studies demonstrate no obvious fracture.  However, there is swelling in the with a positive fat pad sign consistent with an occult radial head fracture.    I had a chance to review the outside records of the emergency department visit.  These records show that she had an x-ray obtained and was placed in a posterior splint and sling for possible radial head fracture.  She was advised to see an orthopedic surgeon    IMPRESSION: Left radial head fracture    PLAN: I reviewed my findings with the patient.  We will treat this fracture nonoperatively.  This will begin with early range of motion as well as physical therapy.  She will be minimal weightbearing on that side.  I like to see her back in 2 weeks with new x-rays.  All questions were answered

## 2016-06-15 ENCOUNTER — Inpatient Hospital Stay: Payer: Commercial Managed Care - POS | Attending: Orthopaedic Surgery

## 2016-06-15 DIAGNOSIS — M6281 Muscle weakness (generalized): Secondary | ICD-10-CM | POA: Insufficient documentation

## 2016-06-15 DIAGNOSIS — M25522 Pain in left elbow: Secondary | ICD-10-CM | POA: Insufficient documentation

## 2016-06-15 NOTE — PT/OT Exercise Plan (Signed)
Name: Kara Boyd "Kara Boyd"   Referring Physician: Verner Chol, MD  Diagnosis:     ICD-10-CM    1. Left elbow pain M25.522    2. Muscle weakness M62.81         Precautions:  PWB of the L UE Date of Surgery:   -- MD Follow-up: Pending - forgot exact date          Exercise Flow Sheet    Exercise Specifics              UBE                   Wrist flex/ext               Wrist RD/UD                 PRE scap, flex                 Prone row/H Abd                 putty                   Gripper                 digiflex                 Wrist maze                                                 Home Exercise Program    Review HEP             (Initials = supervised exercise by clinician)    Access Code: [email or web address]   URL: [email or web address]   Date: 06/15/2016   Prepared by: Sekai Nayak     Exercises   Seated Wrist Flexion Stretch - 3 reps - 1 sets - 30 hold - 2x daily - 7x weekly   Seated Wrist Extension Stretch - 3 reps - 1 sets - 30 hold - 2x daily - 7x weekly   Standing Elbow Flexion Extension AROM - 10 reps - 3 sets - 3 hold - 1x daily - 7x weekly   Seated Forearm Supination Pronation - 10 reps - 3 sets - 3 hold - 1x daily - 7x weekly   Towel Roll Grip with Forearm in Neutral - 10 reps - 3 sets - 3 hold - 1x daily - 5x weekly

## 2016-06-15 NOTE — PT/OT Therapy Note (Signed)
INITIAL EVALUATION (Elbow/Wrist)    Name: Kara Boyd Age: 36 y.o. Occupation: deskjob - resettles refugees SOC: 06/15/2016  Referring Physician: Verner Chol, MD MD recheck: Pending  DOS:   N/A  DOI: Onset of Problem / Injury: 06/08/16  Diagnosis (Treating/Medical): Diagnoses of Left elbow pain and Muscle weakness were pertinent to this visit.   # of Authorized Visits:   Visit #   1    SUBJECTIVE: Pt reports that she fell on the ice in the beginning of January. Pt states that she had a splint for 3 days from the ED and then went to the orthopedist 3 days ago who took it off and states that MD wanted her to move it. Pt states that sometimes she gets some N/T into the L UE, but that it does go into the pinky and middle finger. Pt states that her behind hit first and then elbow broke the rest of her fall. Pt states that she was with her daughter and went to ED with husband. Pt states that she is driving, but it is painful to use the L hand.     Mechanism of Injury: fell on ice     Aggravating factors: driving  Relieving factors: use of ice  Pain characteristics: dull,achy, throbbing, can be N/T     Patient's reason for seeking PT / Goal:  to be able to get strength back into the arm.     Past Medical History:   Past Medical History:   Diagnosis Date   . Anxiety    . Depression    . Diabetes mellitus      Medications: .  buPROPion XL (WELLBUTRIN XL) 300 MG 24 hr tablet  .  busPIRone (BUSPAR) 30 MG tablet  .  canagliflozin (INVOKANA) 100 MG Tab tablet  .  Dulaglutide (TRULICITY SC)  .  escitalopram (LEXAPRO) 5 MG tablet  .  LORazepam (ATIVAN) 1 MG tablet  .  traMADol (ULTRAM) 50 MG tablet   Fall Risks: History of fall(s) Fall Risk Comments: on ice to break radial head  Other Treatment/Prior Therapy: Yes for back   Prior Hospitalization:    Hand Dominance: Dominant Hand: Right Involved Side: Involved Side: Left   DiagnosticTests: X-rays - no fx seen at that point.     Outcome Measure:         Quick Dash L Score:  68                              % Pain Score: 57% Rate Satisfaction with Current Function: 3/10     Functional Limitations (PLOF): unable to drive (able no issue), twisting the wrist hurts (able no issue), unable to carry objects with hand (able no issue), unable to sleep on the L side (able no issue), wakes up 1-2x/night (able no issue), unable to pull pants ups without pain (able no issue), unable to put on deodorant without issues (able no issue), brushing the hair is difficulty (able no issue)  Living Environment:    Patient lives with: Living Arrangements: Family    OBJECTIVE:    Vitals: BP: 123/85 Heart Rate: 88      Observation/Posture/Gait/Integumentary:  Observation of posture: Deficits noted: Forward Head and Rounded Shoulders  Gait: Other: NT  Girth: Other: 30 cm R, 27 cm L around epicondyles  Integumentary: Other: bruising on the medial L elbow joint  Palpation: Pain to palpation: along the L medial epicondyle  and into the deltoid  Joint Mobility Assessment: NT secondary to fracture  End feel: Empty pt subject reports    Range of Motion: (degrees)    Elbow / Wrist L AROM  Initial  R AROM  Initial    Elbow flexion 137 p! 142   Elbow Extension 0 +8   Forearm Supination 81 p! 82    Forearm Pronation 83 p! 90   Wrist Flexion 58 86   Wrist Extension 60 63   Radial Deviation 20 p! 28    Ulnar Deviation 12 20   (blank fields were intentionally left blank)    Cervical AROM: WFL  Shoulder AROM: WFL    Initial L Initial R LE Strength  MMT /5 L R   4 p! 5 Elbow Flexion     4 p! 5 Elbow Extension     4+ p! 5 Forearm Supination     4+ p! 5 Forearm Pronation     4+ p! 5 Wrist Flexion     4+ 5 Wrist Extension     4+ p! 5 Radial Deviation     4+ p! 5 Ulnar Deviation       Mid Trap       Lower Trap     (blank fields were intentionally left blank)    Dynamometer Grip Strength:  R: elbow flexed 72,75,70  elbow extended 71,68,70  L:  elbow flexed 45,45,55  elbow extended 50,50,40    Special Tests/Neurological Screen:   L R   L R   Varus Stress  (Lateral collateral ligament) (-) (-) Phalens (CTS) NT NT   Valgus Stress  (Medial collateral ligament) (-) (-) Finkelstein (paratenonitis in thumb) NT NT   Tinels (CTS) NT NT TOS Screen NT NT             Deep Tendon Reflex R L   C5 Biceps NT NT   C5-C6 Brachioradialis NT NT   C7 Triceps NT NT     ULTT L R   Median Nerve NT NT   Radial Nerve NT NT   Ulnar Nerve NT NT     Sensation to Light touch: Intact    Treatment Initial Visit:  Evaluation   Patient Education on HEP, use of ice at home, swelling, fracture healing time.   Therapeutic exercise with instruction in HEP and provided patient written and illustrated handout Yes  Therapeutic Activity N/A  Manual N/A  Modalities: None  Rehab Potential:good  Is patient aware of diagnosis: Yes  For Next Visit Add MT, Therex per flow, begin slight strengthening exercises with TB activities.     Dorothea Ogle, PT, DPT  [place] [address]  06/15/2016    Total Treatment (billable) Time:  45  Total Timed Minutes:  15      Plan of Care IPTC Medicare Provider #: [id number]                Patient Name: Kara Boyd  MRN: [id number]  DOI: Onset of Problem / Injury: 06/08/16 DOS: N/A  SOC:06/15/2016    Diagnosis:     ICD-10-CM    1. Left elbow pain M25.522    2. Muscle weakness M62.81        ASSESSMENT: the patient is a 36 y.o. female presenting with L UE who requires Physical Therapy for the following:  Impairments: decreased L UE strength, decreased L AROM, decreased grip strength, edema of the L UE, postural deficits    Barriers to Rehabilitations/Comorbidities/personal  factors:  Comorbidities high BMI  Safety concerns Anxiety and PTSD - has 2 children constantly chasing after.   Patient's home situation/support system has mother and father at home to help take home responsibilities.     Pain located: L elbow and wrist    Clinical presentation: stable L elbow pain, no radicular symptoms.     Functional Limitations (PLOF): unable to drive (able no issue), twisting the  wrist hurts (able no issue), unable to carry objects with hand (able no issue), unable to sleep on the L side (able no issue), wakes up 1-2x/night (able no issue), unable to pull pants ups without pain (able no issue), unable to put on deodorant without issues (able no issue), brushing the hair is difficulty (able no issue)    Plan Of Care: Body Mechanics Education, NMR, Proprioceptive Activites, Statistician, Instruction in HEP, Therapeutic Exercise, Therapeutic Activities, Balance/Gait training and Soft Tissue/Joint Mobilization STM to L UE, joint mobs as appropraite to L UE grade I-IV to L UE, manual stretching to L UE.     Frequency/Duration: 2 times a week for 12 sessions. Certification Status Ends: 08/13/2016    Goals:  Date (Body Area, Impairment Goal, Functional   Activity, Target Performance) Time Frame Status Date/  Initial   06/15/16   Patient will demonstrate independence in prescribed HEP with proper form, sets and reps for safe discharge to an independent program.  12 sessions Initial Eval    06/15/16   Improve elbow extension strength to 5/5 to transfer out of chair or push open doors with involved side.  12 sessions Initial Eval    06/15/16   Improve elbow pronation and supination strength to 5/5 to be able to brush hair without pain.   12 sessions Initial Eval    06/15/16   Increase grip strength to 70 pounds to allow patient to be able to pull on pants without pain.   12 sessions Initial Eval    06/15/16   Improve elbow flexion strength to 5/5 to be able to carry purse/groceries in the L UE.   12 sessions Initial Eval      Signature: Dorothea Ogle, PT, DPT  [place] [facility] Date: 06/15/2016    Signature: Verner Chol, MD _______________________  Date:  ________________     Patient Name: Kara Boyd  MRN: [id number]

## 2016-06-17 ENCOUNTER — Inpatient Hospital Stay: Payer: Commercial Managed Care - POS

## 2016-06-23 ENCOUNTER — Inpatient Hospital Stay: Payer: Commercial Managed Care - POS | Attending: Orthopaedic Surgery

## 2016-06-23 DIAGNOSIS — M6281 Muscle weakness (generalized): Secondary | ICD-10-CM | POA: Insufficient documentation

## 2016-06-23 DIAGNOSIS — M25522 Pain in left elbow: Secondary | ICD-10-CM | POA: Insufficient documentation

## 2016-06-23 NOTE — PT/OT Exercise Plan (Signed)
Name: Kara Hiraldo "[name]"   Referring Physician: Verner Chol, MD  Diagnosis:     ICD-10-CM    1. Left elbow pain M25.522    2. Muscle weakness M62.81         Precautions:  PWB of the L UE Date of Surgery:   -- MD Follow-up: Pending - forgot exact date          Exercise Flow Sheet    Exercise Specifics 06/23/16             UBE    Held/fear *UBE              Wrist flex/ext  Verbal review  MSP             Wrist RD/UD    Verbal review  MSP             PRE scap, flex    10 x 2 and abd  Ea  MSP             Prone row/H Abd    10 x 2  MSP             putty    2'  MSP               Gripper  ---               digiflex  R digi flex  2' MSP               Wrist maze  3'  MSP                                               Home Exercise Program    Review HEP             (Initials = supervised exercise by clinician)    Access Code: [email or web address]   URL: [email or web address]   Date: 06/15/2016   Prepared by: Afton Hendershot     Exercises   Seated Wrist Flexion Stretch - 3 reps - 1 sets - 30 hold - 2x daily - 7x weekly   Seated Wrist Extension Stretch - 3 reps - 1 sets - 30 hold - 2x daily - 7x weekly   Standing Elbow Flexion Extension AROM - 10 reps - 3 sets - 3 hold - 1x daily - 7x weekly   Seated Forearm Supination Pronation - 10 reps - 3 sets - 3 hold - 1x daily - 7x weekly   Towel Roll Grip with Forearm in Neutral - 10 reps - 3 sets - 3 hold - 1x daily - 5x weekly

## 2016-06-23 NOTE — PT/OT Therapy Note (Signed)
DAILY NOTE   06/23/2016        Total Treatment (billable)Time: 43  Total Timed Minutes:  43 Visit Number: 2      Payor: CIG[employer] / Plan: C[employer] POS / Product Type: COMMERCIAL /    # of Authorized Visits: 12 Visit #: 2      Diagnosis (Treating/Medical):     ICD-10-CM    1. Left elbow pain M25.522    2. Muscle weakness M62.81            Subjective:  Kara Boyd reports that she keeps getting a clicking feeling on the medial side of her elbow whenever she flexes her elbow. Pt reports she also gets an increase in pain with twisting motions like "when opening a door". Pt reports that she is to follow up with MD tomorrow. Pt reports driving is getting easier unless needing to make a sharp. Pt continues to have N&T in her 4th and 5th digits LUE when elbow flexed.  Functional Status: see above    Objective:   Treatment:  Therapeutic Exercise: to improve: Balance, Flexibility/ROM, Stabilization and Strength   Warm-up:  Held secondary to patient fear with subjective obtained to assist with today's treatment session   Modifications/Patient Education: review of HEP for improved compliance and form.  Verbal, Visual and Tactile cues to not push into pain with AROM as per flow sheet. "tucking scapular in pockets" verbal cue to improve sitting posture and scapular motor control. verbalc ueing with grip motions to keep wrist neutral wrist  ROM/Manual Stretches: supination/pronation    NMR:  Tactile cueing with prone row and h. abd to improve periscapular motor control  Tactile cueing with PRE scaption and flexion to improve motor control with scapular depression to reduce humeral IR and abducted scapula    Therapeutic Activities:  ---    Manual Therapy:   STM through wrist flexors and extensors  GII radial ulnar mobilization for pain reduction       Initial Evaluation Reference and/orCurrent Measurements (ROM, Strength, Girth, Outcomes, etc.):      Range of Motion: (degrees)    Elbow / Wrist L AROM  Initial  R AROM  Initial    Elbow flexion  137 p! 142   Elbow Extension 0 +8   Forearm Supination 81 p! 82    Forearm Pronation 83 p! 90   Wrist Flexion 58 86   Wrist Extension 60 63   Radial Deviation 20 p! 28    Ulnar Deviation 12 20   (blank fields were intentionally left blank)    Cervical AROM: WFL  Shoulder AROM: WFL    Initial L Initial R LE Strength  MMT /5 L R   4 p! 5 Elbow Flexion     4 p! 5 Elbow Extension     4+ p! 5 Forearm Supination     4+ p! 5 Forearm Pronation     4+ p! 5 Wrist Flexion     4+ 5 Wrist Extension     4+ p! 5 Radial Deviation     4+ p! 5 Ulnar Deviation       Mid Trap       Lower Trap     (blank fields were intentionally left blank)    Dynamometer Grip Strength:  R: elbow flexed 72,75,70  elbow extended 71,68,70  L:  elbow flexed 45,45,55  elbow extended 50,50,40    Special Tests/Neurological Screen:   L R  L R   Varus Stress  (Lateral  collateral ligament) (-) (-) Phalens (CTS) NT NT   Valgus Stress  (Medial collateral ligament) (-) (-) Finkelstein (paratenonitis in thumb) NT NT   Tinels (CTS) NT NT TOS Screen NT NT             Deep Tendon Reflex R L   C5 Biceps NT NT   C5-C6 Brachioradialis NT NT   C7 Triceps NT NT     ULTT L R   Median Nerve NT NT   Radial Nerve NT NT   Ulnar Nerve NT NT         Modalities: None  Therapy Rationale: Other: educaiton over proper timeing for ince to not use more than       Assessment (response to treatment):   Heavy patient education over proper performance and execution of HEP. Review of not pushing into pain in order to ocntrol motion and pain tolerance at this time during AROM phase. Pt is able to complete all HEP in clinic with no significant increase in pain or adverse effects. Pt education on forearm WBing contraindicated at this time. PT education on ulnar nn symptoms and possible aggegation with elbow flexed and swelling. Pt education on cushion under forearm at work for decrease irration in joint.     Progress towards functional goals: see above    Patient requires continued  skilled care to: to improve LUE mobility to be able to carry objects for child care    Plan:  Continue with Plan of Care. Follow up with MD visit. AROM assessment. Pt requesting to go down to 1x a week when appropriate secondary to financial situation.     Kara Boyd PT, DPT, CSCS   [place] [address]   06/23/2016     Information below copied from evaluation as reference information unless noted/dated on the goal grid:    Functional Limitations (PLOF): unable to drive (able no issue), twisting the wrist hurts (able no issue), unable to carry objects with hand (able no issue), unable to sleep on the L side (able no issue), wakes up 1-2x/night (able no issue), unable to pull pants ups without pain (able no issue), unable to put on deodorant without issues (able no issue), brushing the hair is difficulty (able no issue)     Plan Of Care: Body Mechanics Education, NMR, Proprioceptive Activites, Statistician, Instruction in HEP, Therapeutic Exercise, Therapeutic Activities, Balance/Gait training and Soft Tissue/Joint Mobilization STM to L UE, joint mobs as appropraite to L UE grade I-IV to L UE, manual stretching to L UE.      Frequency/Duration: 2 times a week for 12 sessions.        Certification Status Ends: 08/13/2016     Goals:  Date (Body Area, Impairment Goal, Functional   Activity, Target Performance) Time Frame Status Date/  Initial   06/15/16   Patient will demonstrate independence in prescribed HEP with proper form, sets and reps for safe discharge to an independent program.  12 sessions Initial Eval     06/15/16   Improve elbow extension strength to 5/5 to transfer out of chair or push open doors with involved side.  12 sessions Initial Eval     06/15/16   Improve elbow pronation and supination strength to 5/5 to be able to brush hair without pain.   12 sessions Initial Eval     06/15/16   Increase grip strength to 70 pounds to allow patient to be able to pull on pants without pain.  12 sessions  Initial Eval     06/15/16   Improve elbow flexion strength to 5/5 to be able to carry purse/groceries in the L UE.   12 sessions Initial Eval

## 2016-06-25 ENCOUNTER — Ambulatory Visit (INDEPENDENT_AMBULATORY_CARE_PROVIDER_SITE_OTHER): Payer: Commercial Managed Care - POS | Admitting: Orthopaedic Surgery

## 2016-06-25 ENCOUNTER — Encounter (INDEPENDENT_AMBULATORY_CARE_PROVIDER_SITE_OTHER): Payer: Self-pay | Admitting: Orthopaedic Surgery

## 2016-06-25 ENCOUNTER — Ambulatory Visit (INDEPENDENT_AMBULATORY_CARE_PROVIDER_SITE_OTHER): Payer: Commercial Managed Care - POS

## 2016-06-25 ENCOUNTER — Encounter (INDEPENDENT_AMBULATORY_CARE_PROVIDER_SITE_OTHER): Payer: Self-pay

## 2016-06-25 VITALS — BP 121/63 | HR 64 | Ht 69.0 in | Wt 243.0 lb

## 2016-06-25 DIAGNOSIS — S42402D Unspecified fracture of lower end of left humerus, subsequent encounter for fracture with routine healing: Secondary | ICD-10-CM

## 2016-06-25 NOTE — Progress Notes (Signed)
Patient returns follow-up for her left radial head fracture.  Physical therapy is going well.  Her motions improving it.  She still has some discomfort in her elbow but otherwise feels that she is progressing.    Physical examination: Left elbow shows no gross deformity.  Range of motion is 0-130 degrees compared to -3 to 1:30 on the contralateral side.  She has full pronation, supination.  She has tenderness over the radial head.  She is otherwise neurovascular intact.    Imaging studies: Left elbow x-rays, AP, lateral and oblique views were obtained and reviewed by me.  These imaging studies demonstrate a nondisplaced radial head fracture.  No  change in alignment or displacement is noted.    Impression: Left radial head fracture.    Plan: She will continue to work with physical therapy emphasizing range of motion and gentle strengthening.  No lifting greater than 5 pounds on that side for now.  I will see her back in 4-6 weeks for another follow-up with x-rays

## 2016-06-25 NOTE — Progress Notes (Signed)
I completed the FMLA forms at the request of Kara Boyd. She will continue in PT twice a week, through approximately 08/07/16. Estimated duration of her condition for the FMLA is 06/08/16 - 08/29/16. [name] will turn the forms in to her HR department. EP

## 2016-06-26 ENCOUNTER — Inpatient Hospital Stay: Payer: Commercial Managed Care - POS | Attending: Orthopaedic Surgery

## 2016-06-26 DIAGNOSIS — M25522 Pain in left elbow: Secondary | ICD-10-CM | POA: Insufficient documentation

## 2016-06-26 DIAGNOSIS — M6281 Muscle weakness (generalized): Secondary | ICD-10-CM | POA: Insufficient documentation

## 2016-06-26 NOTE — PT/OT Therapy Note (Signed)
DAILY NOTE   06/26/2016        Total Treatment (billable)Time: 43  Total Timed Minutes:  43 Visit Number: 3      Payor: CIGNA / Plan: CIGNA POS / Product Type: COMMERCIAL /    # of Authorized Visits: 12 Visit #: 3      Diagnosis (Treating/Medical):     ICD-10-CM    1. Left elbow pain M25.522    2. Muscle weakness M62.81            Subjective:  Kara Boyd reports that she still has pain to her wrist extensors. Her MD said to continue PT but weight restrictions of 3lbs.   Functional Status: see above    Objective:   Treatment:  Therapeutic Exercise: to improve: Balance, Flexibility/ROM, Stabilization and Strength   Warm-up:  Held secondary to patient fear with subjective obtained to assist with today's treatment session   Modifications/Patient Education: verbal cues with use of the "maze" to avoid use of her shld.   ROM/Manual Stretches: supination/pronation    NMR:  Tactile cueing with prone row and h. abd to depress and then retract her scap   Tactile cueing with PRE scaption and flexion to promote scapular depression to reduce humeral IR and abducted scapula    Therapeutic Activities:  ---    Manual Therapy:   STM through wrist flexors and extensors in seated  GII radial ulnar mobilization       Initial Evaluation Reference and/orCurrent Measurements (ROM, Strength, Girth, Outcomes, etc.):      Range of Motion: (degrees)    Elbow / Wrist L AROM  Initial  R AROM  Initial  L  AROM     Elbow flexion 137 p! 142 137p!   Elbow Extension 0 +8 0   Forearm Supination 81 p! 82     Forearm Pronation 83 p! 90    Wrist Flexion 58 86    Wrist Extension 60 63    Radial Deviation 20 p! 28     Ulnar Deviation 12 20    (blank fields were intentionally left blank)    Cervical AROM: WFL  Shoulder AROM: WFL    Initial L Initial R LE Strength  MMT /5 L R   4 p! 5 Elbow Flexion     4 p! 5 Elbow Extension     4+ p! 5 Forearm Supination     4+ p! 5 Forearm Pronation     4+ p! 5 Wrist Flexion     4+ 5 Wrist Extension     4+ p! 5 Radial  Deviation     4+ p! 5 Ulnar Deviation       Mid Trap       Lower Trap     (blank fields were intentionally left blank)    Dynamometer Grip Strength:  R: elbow flexed 72,75,70  elbow extended 71,68,70  L:  elbow flexed 45,45,55  elbow extended 50,50,40    Special Tests/Neurological Screen:   L R  L R   Varus Stress  (Lateral collateral ligament) (-) (-) Phalens (CTS) NT NT   Valgus Stress  (Medial collateral ligament) (-) (-) Finkelstein (paratenonitis in thumb) NT NT   Tinels (CTS) NT NT TOS Screen NT NT             Deep Tendon Reflex R L   C5 Biceps NT NT   C5-C6 Brachioradialis NT NT   C7 Triceps NT NT     ULTT L R  Median Nerve NT NT   Radial Nerve NT NT   Ulnar Nerve NT NT         Modalities: None  Therapy Rationale: Other: educaiton over proper timeing for ince to not use more than       Assessment (response to treatment):   Pt with some improvement in L elbow extension but still limited. Educated to not use the "stress ball" for a long time period all at once to avoid tendonitis. She did have considerable tightness and some tenderness to her wrist extensors especially at the distal end. She was cues with all wrist stretches to avoid pushing through pain.   Progress towards functional goals: none yet.     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 (continue to assess)    Elliot Dally, LPTA   06/26/2016     Information below copied from evaluation as reference information unless noted/dated on the goal grid:    Functional Limitations (PLOF): unable to drive (able no issue), twisting the wrist hurts (able no issue), unable to carry objects with hand (able no issue), unable to sleep on the L side (able no issue), wakes up 1-2x/night (able no issue), unable to pull pants ups without pain (able no issue), unable to  put on deodorant without issues (able no issue), brushing the hair is difficulty (able no issue)     Plan Of Care: Body Mechanics Education, NMR, Proprioceptive Activites, Statistician, Instruction in HEP, Therapeutic Exercise, Therapeutic Activities, Balance/Gait training and Soft Tissue/Joint Mobilization STM to L UE, joint mobs as appropraite to L UE grade I-IV to L UE, manual stretching to L UE.      Frequency/Duration: 2 times a week for 12 sessions.        Certification Status Ends: 08/13/2016     Goals:  Date (Body Area, Impairment Goal, Functional   Activity, Target Performance) Time Frame Status Date/  Initial   06/15/16   Patient will demonstrate independence in prescribed HEP with proper form, sets and reps for safe discharge to an independent program.  12 sessions Initial Eval     06/15/16   Improve elbow extension strength to 5/5 to transfer out of chair or push open doors with involved side.  12 sessions Initial Eval     06/15/16   Improve elbow pronation and supination strength to 5/5 to be able to brush hair without pain.   12 sessions Initial Eval     06/15/16   Increase grip strength to 70 pounds to allow patient to be able to pull on pants without pain.   12 sessions Initial Eval     06/15/16   Improve elbow flexion strength to 5/5 to be able to carry purse/groceries in the L UE.   12 sessions Initial Eval

## 2016-06-26 NOTE — PT/OT Exercise Plan (Signed)
Name: Kara Boyd "Kara Boyd"   Referring Physician: Verner Chol, MD  Diagnosis:     ICD-10-CM    1. Left elbow pain M25.522    2. Muscle weakness M62.81         Precautions:  PWB of the L UE Date of Surgery:   -- MD Follow-up: Pending - forgot exact date          Exercise Flow Sheet    Exercise Specifics 06/23/16 06/26/16            UBE    Held/fear UBE  3'3'  120 rpm  aw              Wrist flex/ext  Verbal review  MSP --            Wrist RD/UD    Verbal review  MSP --            PRE scap, flex    10 x 2 and abd  Ea  MSP 2x10 ea  aw            Prone row/H Abd    10 x 2  MSP 2x10ea  aw            putty    2'  MSP 2'  aw              Gripper  --- --              digiflex  R digi flex  2' MSP x2'  Red  aw              Wrist maze  3'  MSP 3'  aw                                              Home Exercise Program    Review HEP -            (Initials = supervised exercise by clinician)    Access Code: [email or web address]   URL: [email or web address]   Date: 06/15/2016   Prepared by: Afton Hendershot     Exercises   Seated Wrist Flexion Stretch - 3 reps - 1 sets - 30 hold - 2x daily - 7x weekly   Seated Wrist Extension Stretch - 3 reps - 1 sets - 30 hold - 2x daily - 7x weekly   Standing Elbow Flexion Extension AROM - 10 reps - 3 sets - 3 hold - 1x daily - 7x weekly   Seated Forearm Supination Pronation - 10 reps - 3 sets - 3 hold - 1x daily - 7x weekly   Towel Roll Grip with Forearm in Neutral - 10 reps - 3 sets - 3 hold - 1x daily - 5x weekly

## 2016-06-30 ENCOUNTER — Inpatient Hospital Stay: Payer: Commercial Managed Care - POS | Attending: Orthopaedic Surgery

## 2016-06-30 DIAGNOSIS — M25522 Pain in left elbow: Secondary | ICD-10-CM

## 2016-06-30 DIAGNOSIS — M6281 Muscle weakness (generalized): Secondary | ICD-10-CM | POA: Insufficient documentation

## 2016-06-30 NOTE — PT/OT Exercise Plan (Signed)
Name: Kara Boyd "Kara Boyd"   Referring Physician: Verner Chol, MD  Diagnosis:     ICD-10-CM    1. Left elbow pain M25.522    2. Muscle weakness M62.81         Precautions:  PWB of the L UE Date of Surgery:   -- MD Follow-up: Pending - forgot exact date          Exercise Flow Sheet    Exercise Specifics 06/23/16 06/26/16 06/30/16           UBE    Held/fear UBE  3'3'  120 rpm  aw UBE  3'/3'  MSP             Wrist flex/ext  Verbal review  MSP --            Wrist RD/UD    Verbal review  MSP --            PRE scap, flex    10 x 2 and abd  Ea  MSP 2x10 ea  aw #1  2 x 10 ea  MSP           Prone row/H Abd    10 x 2  MSP 2x10ea  aw #1  10 x 2  MSP           putty    2'  MSP 2'  aw 2'  [facility]             Gripper  --- -- --             digiflex  R digi flex  2' MSP x2'  Red  aw Black  2'  MSP             Wrist maze  3'  MSP 3'  aw                  Steering wheel   2#  30X  MSP                            Home Exercise Program    Review HEP -            (Initials = supervised exercise by clinician)    Access Code: [email or web address]   URL: [email or web address]   Date: 06/15/2016   Prepared by: Afton Hendershot     Exercises   Seated Wrist Flexion Stretch - 3 reps - 1 sets - 30 hold - 2x daily - 7x weekly   Seated Wrist Extension Stretch - 3 reps - 1 sets - 30 hold - 2x daily - 7x weekly   Standing Elbow Flexion Extension AROM - 10 reps - 3 sets - 3 hold - 1x daily - 7x weekly   Seated Forearm Supination Pronation - 10 reps - 3 sets - 3 hold - 1x daily - 7x weekly   Towel Roll Grip with Forearm in Neutral - 10 reps - 3 sets - 3 hold - 1x daily - 5x weekly

## 2016-06-30 NOTE — PT/OT Therapy Note (Signed)
DAILY NOTE   06/30/2016        Total Treatment (billable)Time: 43  Total Timed Minutes:  43 Visit Number: 4      Payor: CIGNA / Plan: CIGNA POS / Product Type: COMMERCIAL /    # of Authorized Visits: 12 Visit #: 4      Diagnosis (Treating/Medical):     ICD-10-CM    1. Left elbow pain M25.522    2. Muscle weakness M62.81            Subjective:  Kara Boyd reports that she had her 37 year old son pull down on her injured LUE last night and felt a sharp pain through her forearm. Pt is able to complete full AROM still and reports no increase in swelling. "It feels just about like it did before now it just scared me". Pt states that she is trying her best to not lift more than 5# as per MD orders but "it's hard".   Functional Status: see above    Objective:   Treatment:  Therapeutic Exercise: to improve: Balance, Flexibility/ROM, Stabilization and Strength   Warm-up:  Held secondary to patient fear with subjective obtained to assist with today's treatment session   Modifications/Patient Education: verbal cues with use of the "maze" to avoid use of her shld. Advises patient to use own hand to tactile cue for no movement through shoulder.   ROM/Manual Stretches: supination/pronation    NMR:  Tactile cueing with prone row and h. abd to depress and then retract her scap   Tactile cueing with PRE scaption and flexion to promote scapular depression to reduce humeral IR and abducted scapula  Steering wheel to improve periscap motor control to facilitate radial ulnar strength.     Therapeutic Activities:  ---    Manual Therapy:   STM through wrist flexors and extensors in seated  GII radial ulnar mobilization for pain relief      Initial Evaluation Reference and/orCurrent Measurements (ROM, Strength, Girth, Outcomes, etc.):      Range of Motion: (degrees)    Elbow / Wrist L AROM  Initial  R AROM  Initial  L  AROM     Elbow flexion 137 p! 142 137p!   Elbow Extension 0 +8 0   Forearm Supination 81 p! 82     Forearm Pronation 83 p! 90     Wrist Flexion 58 86    Wrist Extension 60 63    Radial Deviation 20 p! 28     Ulnar Deviation 12 20    (blank fields were intentionally left blank)    Cervical AROM: [facility]  Shoulder AROM: WFL    Initial L Initial R LE Strength  MMT /5 L  06/30/16   4 p! 5 Elbow Flexion 5   4 p! 5 Elbow Extension 5p!   4+ p! 5 Forearm Supination 5p!   4+ p! 5 Forearm Pronation 5p!   4+ p! 5 Wrist Flexion 5   4+ 5 Wrist Extension 5   4+ p! 5 Radial Deviation 5   4+ p! 5 Ulnar Deviation 5     Mid Trap 3+     Lower Trap 3+   (blank fields were intentionally left blank)    Dynamometer Grip Strength:  R: elbow flexed 72,75,70  elbow extended 71,68,70  L:  elbow flexed 45,45,55  elbow extended 50,50,40    Special Tests/Neurological Screen:   L R  L R   Varus Stress  (Lateral collateral ligament) (-) (-)  Phalens (CTS) NT NT   Valgus Stress  (Medial collateral ligament) (-) (-) Finkelstein (paratenonitis in thumb) NT NT   Tinels (CTS) NT NT TOS Screen NT NT             Deep Tendon Reflex R L   C5 Biceps NT NT   C5-C6 Brachioradialis NT NT   C7 Triceps NT NT     ULTT L R   Median Nerve NT NT   Radial Nerve NT NT   Ulnar Nerve NT NT         Modalities: None  Therapy Rationale: Other: educaiton over proper timeing for ince to not use more than       Assessment (response to treatment):   Pt demonstrates no change of status to L elbow s/p subjective reports this session. Pt continues with full AROM and adequate strength (improved strength and comfort since IE). Pain continues with end range of supination and pronation. Heavy education with patient education over importance of compliance of MD protocol for not lifting more than 5# at this time.     Progress towards functional goals: compliance of HEP     Patient requires continued skilled care to: to improve LUE mobility to be able to carry objects for child care    Plan:  Continue with Plan of Care Pt requesting to go down to 1x a week when appropriate secondary to financial situation.  (continue to assess)    Olen Pel PT, DPT, CSCS   [place] [phone]    06/30/2016     Information below copied from evaluation as reference information unless noted/dated on the goal grid:    Functional Limitations (PLOF): unable to drive (able no issue), twisting the wrist hurts (able no issue), unable to carry objects with hand (able no issue), unable to sleep on the L side (able no issue), wakes up 1-2x/night (able no issue), unable to pull pants ups without pain (able no issue), unable to put on deodorant without issues (able no issue), brushing the hair is difficulty (able no issue)     Plan Of Care: Body Mechanics Education, NMR, Proprioceptive Activites, Statistician, Instruction in HEP, Therapeutic Exercise, Therapeutic Activities, Balance/Gait training and Soft Tissue/Joint Mobilization STM to L UE, joint mobs as appropraite to L UE grade I-IV to L UE, manual stretching to L UE.      Frequency/Duration: 2 times a week for 12 sessions.        Certification Status Ends: 08/13/2016     Goals:  Date (Body Area, Impairment Goal, Functional   Activity, Target Performance) Time Frame Status Date/  Initial   06/15/16   Patient will demonstrate independence in prescribed HEP with proper form, sets and reps for safe discharge to an independent program.  12 sessions Reports compliant with HEP but not protocol 06/30/16  [facility]   06/15/16   Improve elbow extension strength to 5/5 to transfer out of chair or push open doors with involved side.  12 sessions 5/5 with pain 06/30/16  MSP   06/15/16   Improve elbow pronation and supination strength to 5/5 to be able to brush hair without pain.   12 sessions 5/5 with pain 01/30  MSP   06/15/16   Increase grip strength to 70 pounds to allow patient to be able to pull on pants without pain.   12 sessions Initial Eval     06/15/16   Improve elbow flexion strength to 5/5 to be able to carry  purse/groceries in the L UE.   12 sessions 5/5, 5# weight limit  01/30  MSP

## 2016-07-03 ENCOUNTER — Inpatient Hospital Stay: Payer: Commercial Managed Care - POS | Attending: Orthopaedic Surgery

## 2016-07-03 DIAGNOSIS — M6281 Muscle weakness (generalized): Secondary | ICD-10-CM | POA: Insufficient documentation

## 2016-07-03 DIAGNOSIS — M25522 Pain in left elbow: Secondary | ICD-10-CM | POA: Insufficient documentation

## 2016-07-03 NOTE — PT/OT Exercise Plan (Signed)
Name: Kara Boyd "Kara Boyd"   Referring Physician: Verner Chol, MD  Diagnosis:     ICD-10-CM    1. Left elbow pain M25.522    2. Muscle weakness M62.81         Precautions:  PWB of the L UE Date of Surgery:   -- MD Follow-up: Pending - forgot exact date          Exercise Flow Sheet    Exercise Specifics 06/23/16 06/26/16 06/30/16 07/03/16          UBE    Held/fear UBE  3'3'  120 rpm  aw UBE  3'/3'  MSP UBE  3'/3'  AH            Wrist flex/ext  Verbal review  MSP -- -- Eccentric 2#  2x15 L  AH HEP         Wrist RD/UD    Verbal review  MSP -- -- RD Eccentric  1#   2x15  AH HEP         PRE scap, flex    10 x 2 and abd  Ea  MSP 2x10 ea  aw #1  2 x 10 ea  MSP -->  time          Prone row/H Abd    10 x 2  MSP 2x10ea  aw #1  10 x 2  MSP 2# ea  Row, ext, H abduction  2x15 ea  AH HEP         putty    2'  MSP 2'  aw 2'  MSP -- --           Gripper  --- -- -- -- ABC at wall           digiflex  R digi flex  2' MSP x2'  Red  aw Black  2'  MSP -- serratus punch           Wrist maze  3'  MSP 3'  aw --   -- Horizontal wall walk no TB               Steering wheel   2#  30X  MSP 2#  x30  Ah                           Home Exercise Program    Review HEP -  Updated HEP  AH          (Initials = supervised exercise by clinician)    Access Code: [email or web address]   URL: [email or web address]   Date: 06/15/2016   Prepared by: Taiwana Willison     Exercises   Seated Wrist Flexion Stretch - 3 reps - 1 sets - 30 hold - 2x daily - 7x weekly   Seated Wrist Extension Stretch - 3 reps - 1 sets - 30 hold - 2x daily - 7x weekly   Standing Elbow Flexion Extension AROM - 10 reps - 3 sets - 3 hold - 1x daily - 7x weekly   Seated Forearm Supination Pronation - 10 reps - 3 sets - 3 hold - 1x daily - 7x weekly   Towel Roll Grip with Forearm in Neutral - 10 reps - 3 sets - 3 hold - 1x daily - 5x weekly   Access Code: [email or web address]   URL: [email or web address]   Date: 07/03/2016   Prepared by: Lou Irigoyen     Exercises   Seated Wrist Flexion  Stretch - 3 reps - 1 sets - 30 hold - 2x daily - 7x weekly   Seated Wrist Extension Stretch - 3 reps - 1 sets - 30 hold - 2x daily - 7x weekly   Prone Shoulder Row - 10 reps - 2 sets - 3 hold - 1x daily - 7x weekly   Prone Shoulder Extension - Single Arm with Dumbbell - 10 reps - 2 sets - 3 hold - 1x daily - 7x weekly   Prone Single Arm Shoulder Horizontal Abduction with Dumbbell - 10 reps - 2 sets - 3 hold - 1x daily - 7x weekly   Seated Wrist Flexion with Dumbbell - 10 reps - 2 sets - 3 hold - 1x daily - 7x weekly   Seated Wrist Radial Deviation with Dumbbell - 10 reps - 3 sets - 3 hold - 1x daily - 7x weekly

## 2016-07-03 NOTE — PT/OT Therapy Note (Signed)
DAILY NOTE   07/03/2016        Total Treatment (billable)Time: 40  Total Timed Minutes:  40  Visit Number: 5    Payor: CIGNA / Plan: CIGNA POS / Product Type: COMMERCIAL /    # of Authorized Visits: 12 Visit #: 5    Diagnosis (Treating/Medical):     ICD-10-CM    1. Left elbow pain M25.522    2. Muscle weakness M62.81        Subjective:  Kara Boyd  Reports that her elbow feels better every day. Pt states she is getting less clicking and popping of the L UE with elbow flexion and extension. Pt reports that she is trying to avoid use of the L UE - but that she does find herself using her UE for picking up her purse at home.   Functional Status: see above    Objective:   Treatment:  Therapeutic Exercise: to improve: Balance, Flexibility/ROM, Stabilization and Strength   Warm-up:  Held secondary to patient fear with subjective obtained to assist with today's treatment session   Modifications/Patient Education: Therex per flow. Increased weight of prone H abduction, row, and added extension for increased scapular strength. Updated HEP for ability to drop down to 1x/week.     ROM/Manual Stretches: supination/pronation    NMR: --    Therapeutic Activities:  ---    Manual Therapy:   Supine: STM through wrist flexors and extensors        Initial Evaluation Reference and/orCurrent Measurements (ROM, Strength, Girth, Outcomes, etc.):      Range of Motion: (degrees)    Elbow / Wrist L AROM  Initial  R AROM  Initial  L  AROM     Elbow flexion 137 p! 142 137p!   Elbow Extension 0 +8 0   Forearm Supination 81 p! 82     Forearm Pronation 83 p! 90    Wrist Flexion 58 86    Wrist Extension 60 63    Radial Deviation 20 p! 28     Ulnar Deviation 12 20    (blank fields were intentionally left blank)    Cervical AROM: [facility]  Shoulder AROM: WFL    Initial L Initial R LE Strength  MMT /5 L  06/30/16   4 p! 5 Elbow Flexion 5   4 p! 5 Elbow Extension 5p!   4+ p! 5 Forearm Supination 5p!   4+ p! 5 Forearm Pronation 5p!   4+ p! 5 Wrist Flexion 5   4+  5 Wrist Extension 5   4+ p! 5 Radial Deviation 5   4+ p! 5 Ulnar Deviation 5     Mid Trap 3+     Lower Trap 3+   (blank fields were intentionally left blank)    Dynamometer Grip Strength:  R: elbow flexed 72,75,70  elbow extended 71,68,70  L:  elbow flexed 45,45,55  elbow extended 50,50,40    Special Tests/Neurological Screen:   L R  L R   Varus Stress  (Lateral collateral ligament) (-) (-) Phalens (CTS) NT NT   Valgus Stress  (Medial collateral ligament) (-) (-) Finkelstein (paratenonitis in thumb) NT NT   Tinels (CTS) NT NT TOS Screen NT NT               Modalities: None  Therapy Rationale: Other: educaiton over proper timeing for ince to not use more than     Assessment (response to treatment):    Pt continues to progress  with light shoulder strengthening and use of light weights. Began introduction of eccentric wrist flexion/extension strengthening for improved stability and strength of the L elbow. Pt had muscle fatigue with performing weighted wrist ext and RD in seated.  Pt had soft tissue restrictions of the L UE and reduced pain and tenderness after STM. Pt had no adverse reaction to therapy today.     Progress towards functional goals: --    Patient requires continued skilled care to: to improve LUE mobility to be able to carry objects for child care    Plan:  Continue with Plan of Care   Dropped to 1x/week secondary to insurance concerns.     Thurlow Gallaga, PT, DPT  [place] [address]  07/03/2016     Information below copied from evaluation as reference information unless noted/dated on the goal grid:    Functional Limitations (PLOF): unable to drive (able no issue), twisting the wrist hurts (able no issue), unable to carry objects with hand (able no issue), unable to sleep on the L side (able no issue), wakes up 1-2x/night (able no issue), unable to pull pants ups without pain (able no issue), unable to put on deodorant without issues (able no issue), brushing the hair is difficulty (able no issue)     Plan  Of Care: Body Mechanics Education, NMR, Proprioceptive Activites, Statistician, Instruction in HEP, Therapeutic Exercise, Therapeutic Activities, Balance/Gait training and Soft Tissue/Joint Mobilization STM to L UE, joint mobs as appropraite to L UE grade I-IV to L UE, manual stretching to L UE.      Frequency/Duration: 2 times a week for 12 sessions.        Certification Status Ends: 08/13/2016     Goals:  Date (Body Area, Impairment Goal, Functional   Activity, Target Performance) Time Frame Status Date/  Initial   06/15/16   Patient will demonstrate independence in prescribed HEP with proper form, sets and reps for safe discharge to an independent program.  12 sessions Reports compliant with HEP but not protocol 06/30/16  [facility]   06/15/16   Improve elbow extension strength to 5/5 to transfer out of chair or push open doors with involved side.  12 sessions 5/5 with pain 06/30/16  MSP   06/15/16   Improve elbow pronation and supination strength to 5/5 to be able to brush hair without pain.   12 sessions 5/5 with pain 01/30  MSP   06/15/16   Increase grip strength to 70 pounds to allow patient to be able to pull on pants without pain.   12 sessions Initial Eval     06/15/16   Improve elbow flexion strength to 5/5 to be able to carry purse/groceries in the L UE.   12 sessions 5/5, 5# weight limit  01/30  MSP

## 2016-07-07 ENCOUNTER — Inpatient Hospital Stay: Payer: Commercial Managed Care - POS | Attending: Orthopaedic Surgery

## 2016-07-07 DIAGNOSIS — M25522 Pain in left elbow: Secondary | ICD-10-CM | POA: Insufficient documentation

## 2016-07-07 DIAGNOSIS — M6281 Muscle weakness (generalized): Secondary | ICD-10-CM | POA: Insufficient documentation

## 2016-07-07 NOTE — PT/OT Therapy Note (Signed)
DAILY NOTE   07/07/2016        Total Treatment (billable)Time: 40  Total Timed Minutes:  40  Visit Number: 6    Payor: CIGNA / Plan: CIGNA POS / Product Type: COMMERCIAL /    # of Authorized Visits: 12 Visit #: 6    Diagnosis (Treating/Medical):     ICD-10-CM    1. Left elbow pain M25.522    2. Muscle weakness M62.81        Subjective:  Kara Boyd  reports that she wasn't as good about doing her exercises at home - but that her elbow feels like it is almost back to normal. Pt reports that she has been trying to make a conscious effort to reduce the amount of lifting with her L UE.   Functional Status: see above    Objective:   Treatment:  Therapeutic Exercise: to improve: Balance, Flexibility/ROM, Stabilization and Strength   Warm-up:  On UBE patient fear with subjective obtained to assist with today's treatment session.   Modifications/Patient Education: Therex per flow. Increased weight of prone H abduction, row, and added extension for increased scapular strength. Updated HEP for ability to drop down to 1x/week.     ROM/Manual Stretches: supination/pronation    NMR: Added supine serratus punch with light weight to work on increased postural strength and scapular motor control.     Therapeutic Activities:  ---    Manual Therapy:   Supine: IASTM through wrist flexors and extensors        Initial Evaluation Reference and/orCurrent Measurements (ROM, Strength, Girth, Outcomes, etc.):      Range of Motion: (degrees)    Elbow / Wrist L AROM  Initial  R AROM  Initial  L  AROM     Elbow flexion 137 p! 142 137p!   Elbow Extension 0 +8 0   Forearm Supination 81 p! 82     Forearm Pronation 83 p! 90    Wrist Flexion 58 86    Wrist Extension 60 63    Radial Deviation 20 p! 28     Ulnar Deviation 12 20    (blank fields were intentionally left blank)    Cervical AROM: WFL  Shoulder AROM: WFL    Initial L Initial R LE Strength  MMT /5 L  06/30/16 L  07/07/16   4 p! 5 Elbow Flexion 5 5   4  p! 5 Elbow Extension 5p! 5   4+ p! 5 Forearm  Supination 5p! 5 sore   4+ p! 5 Forearm Pronation 5p! 5 sore   4+ p! 5 Wrist Flexion 5 5   4+ 5 Wrist Extension 5 5   4+ p! 5 Radial Deviation 5 5   4+ p! 5 Ulnar Deviation 5 5     Mid Trap 3+ 4-     Lower Trap 3+ 4-   (blank fields were intentionally left blank)    Dynamometer Grip Strength:  R: elbow flexed 72,75,70  elbow extended 71,68,70  L:  elbow flexed 45,45,55  elbow extended 50,50,40    Special Tests/Neurological Screen:   L R  L R   Varus Stress  (Lateral collateral ligament) (-) (-) Phalens (CTS) NT NT   Valgus Stress  (Medial collateral ligament) (-) (-) Finkelstein (paratenonitis in thumb) NT NT   Tinels (CTS) NT NT TOS Screen NT NT               Modalities: None  Therapy Rationale: Other: educaiton over proper timeing for  ince to not use more than     Assessment (response to treatment):   Progression of therex of scapular strength and postural control. Pt had muscle fatigue with performing PRE flexion and scaption. Pt had no adverse reaction to therapy today.     Progress towards functional goals: --    Patient requires continued skilled care to: to improve LUE mobility to be able to carry objects for child care    Plan:  Continue with Plan of Care   F/u on use of HEP at home.  MD appt at end of February.   Continue to progress with scapular strengthening exercises for postural re-education and slowly progress with light strengthening of the L UE.      Shevelle Smither, PT, DPT  [place] [address]  07/07/2016     Information below copied from evaluation as reference information unless noted/dated on the goal grid:    Functional Limitations (PLOF): unable to drive (able no issue), twisting the wrist hurts (able no issue), unable to carry objects with hand (able no issue), unable to sleep on the L side (able no issue), wakes up 1-2x/night (able no issue), unable to pull pants ups without pain (able no issue), unable to put on deodorant without issues (able no issue), brushing the hair is difficulty (able no  issue)     Plan Of Care: Body Mechanics Education, NMR, Proprioceptive Activites, Statistician, Instruction in HEP, Therapeutic Exercise, Therapeutic Activities, Balance/Gait training and Soft Tissue/Joint Mobilization STM to L UE, joint mobs as appropraite to L UE grade I-IV to L UE, manual stretching to L UE.      Frequency/Duration: 2 times a week for 12 sessions.        Certification Status Ends: 08/13/2016     Goals:  Date (Body Area, Impairment Goal, Functional   Activity, Target Performance) Time Frame Status Date/  Initial   06/15/16   Patient will demonstrate independence in prescribed HEP with proper form, sets and reps for safe discharge to an independent program.  12 sessions Reports compliant with HEP but not protocol 06/30/16  [facility]   06/15/16   Improve elbow extension strength to 5/5 to transfer out of chair or push open doors with involved side.  12 sessions 5/5 with pain 06/30/16  [facility]   06/15/16   Improve elbow pronation and supination strength to 5/5 to be able to brush hair without pain.   12 sessions 5/5 with pain 01/30  [facility]   06/15/16   Increase grip strength to 70 pounds to allow patient to be able to pull on pants without pain.   12 sessions Initial Eval     06/15/16   Improve elbow flexion strength to 5/5 to be able to carry purse/groceries in the L UE.   12 sessions 5/5, 5# weight limit  01/30  [facility]

## 2016-07-07 NOTE — PT/OT Exercise Plan (Signed)
Name: Kara Boyd "Kara Boyd"   Referring Physician: Verner Chol, MD  Diagnosis:     ICD-10-CM    1. Left elbow pain M25.522    2. Muscle weakness M62.81         Precautions:  PWB of the L UE Date of Surgery:   -- MD Follow-up: Pending - forgot exact date          Exercise Flow Sheet    Exercise Specifics 06/23/16 06/26/16 06/30/16 07/03/16 07/07/16         UBE    Held/fear UBE  3'3'  120 rpm  aw UBE  3'/3'  MSP UBE  3'/3'  AH UBE  3  3'/3'  AH           Wrist flex/ext  Verbal review  MSP -- -- Eccentric 2#  2x15 L  AH HEP         Wrist RD/UD    Verbal review  MSP -- -- RD Eccentric  1#   2x15  AH HEP         PRE scap, flex    10 x 2 and abd  Ea  MSP 2x10 ea  aw #1  2 x 10 ea  MSP -->  time 2#  2x10  AH           Prone row/H Abd    10 x 2  MSP 2x10ea  aw #1  10 x 2  MSP 2# ea  Row, ext, H abduction  2x15 ea  AH HEP         putty    2'  MSP 2'  aw 2'  MSP -- DWS  3x30 sec  AH           Gripper  --- -- -- -- ABC at wall  Flex x1 L  AH           digiflex  R digi flex  2' MSP x2'  Red  aw Black  2'  MSP -- Serratus punch  10x10 sec 5#  AH           Wrist maze  3'  MSP 3'  aw --   -- Horizontal wall walk no TB  x2 hall  AH               Steering wheel   2#  30X  MSP 2#  x30  Ah 2#  x30  AH                            Home Exercise Program    Review HEP -  Updated HEP  AH          (Initials = supervised exercise by clinician)    Access Code: [email or web address]   URL: [email or web address]   Date: 06/15/2016   Prepared by: Masao Junker     Exercises   Seated Wrist Flexion Stretch - 3 reps - 1 sets - 30 hold - 2x daily - 7x weekly   Seated Wrist Extension Stretch - 3 reps - 1 sets - 30 hold - 2x daily - 7x weekly   Standing Elbow Flexion Extension AROM - 10 reps - 3 sets - 3 hold - 1x daily - 7x weekly   Seated Forearm Supination Pronation - 10 reps - 3 sets - 3 hold - 1x daily - 7x weekly   Towel Roll Grip with Forearm in Neutral - 10 reps -  3 sets - 3 hold - 1x daily - 5x weekly   Access Code: [email or web address]   URL:  [email or web address]   Date: 07/03/2016   Prepared by: Alona Danford     Exercises   Seated Wrist Flexion Stretch - 3 reps - 1 sets - 30 hold - 2x daily - 7x weekly   Seated Wrist Extension Stretch - 3 reps - 1 sets - 30 hold - 2x daily - 7x weekly   Prone Shoulder Row - 10 reps - 2 sets - 3 hold - 1x daily - 7x weekly   Prone Shoulder Extension - Single Arm with Dumbbell - 10 reps - 2 sets - 3 hold - 1x daily - 7x weekly   Prone Single Arm Shoulder Horizontal Abduction with Dumbbell - 10 reps - 2 sets - 3 hold - 1x daily - 7x weekly   Seated Wrist Flexion with Dumbbell - 10 reps - 2 sets - 3 hold - 1x daily - 7x weekly   Seated Wrist Radial Deviation with Dumbbell - 10 reps - 3 sets - 3 hold - 1x daily - 7x weekly

## 2016-07-10 ENCOUNTER — Inpatient Hospital Stay: Payer: Commercial Managed Care - POS

## 2016-07-14 ENCOUNTER — Inpatient Hospital Stay: Payer: Commercial Managed Care - POS

## 2016-07-16 ENCOUNTER — Inpatient Hospital Stay: Payer: Commercial Managed Care - POS | Attending: Orthopaedic Surgery

## 2016-07-16 DIAGNOSIS — M25522 Pain in left elbow: Secondary | ICD-10-CM | POA: Insufficient documentation

## 2016-07-16 DIAGNOSIS — M6281 Muscle weakness (generalized): Secondary | ICD-10-CM | POA: Insufficient documentation

## 2016-07-16 NOTE — PT/OT Therapy Note (Signed)
DAILY NOTE   07/16/2016        Total Treatment (billable)Time: 40  Total Timed Minutes:  40  Visit Number: 7    Payor: CIGNA / Plan: CIGNA POS / Product Type: COMMERCIAL /    # of Authorized Visits:   Visit #:      Diagnosis (Treating/Medical):     ICD-10-CM    1. Left elbow pain M25.522    2. Muscle weakness M62.81        Subjective:  Kara Boyd  reports that she continues to do well and has not been having any pain in her L elbow. Pt states "My child tripped me the other day and I fell but it hurt then and then it went away". I really have no problems. I feel like I can be discharged. "I know not to lift any 5# until Im released by my MD".   Functional Status: see above    Objective:   Treatment:  Therapeutic Exercise: to improve: Balance, Flexibility/ROM, Stabilization and Strength   Warm-up:  On UBE patient fear with subjective obtained to assist with today's treatment session.   Modifications/Patient Education: Therex per flow. Increased weight of prone H abduction, row, and added extension for increased scapular strength. Updated HEP to improve scapular strengthening for [place].    ROM/Manual Stretches: supination/pronation    NMR:---    Therapeutic Activities:  ---    Manual Therapy:   Supine: IASTM through wrist flexors and extensors        Initial Evaluation Reference and/orCurrent Measurements (ROM, Strength, Girth, Outcomes, etc.):      Range of Motion: (degrees)    Elbow / Wrist L AROM  Initial  R AROM  Initial  L  AROM     Elbow flexion 137 p! 142 137p!   Elbow Extension 0 +8 0   Forearm Supination 81 p! 82     Forearm Pronation 83 p! 90    Wrist Flexion 58 86    Wrist Extension 60 63    Radial Deviation 20 p! 28     Ulnar Deviation 12 20    (blank fields were intentionally left blank)        Initial L Initial R LE Strength  MMT /5 L  06/30/16 L  07/07/16 L  2/15   4 p! 5 Elbow Flexion 5 5 5   4  p! 5 Elbow Extension 5p! 5 5   4+ p! 5 Forearm Supination 5p! 5 sore 5   4+ p! 5 Forearm Pronation 5p! 5 sore 5   4+ p!  5 Wrist Flexion 5 5 5    4+ 5 Wrist Extension 5 5 5    4+ p! 5 Radial Deviation 5 5 5    4+ p! 5 Ulnar Deviation 5 5 5      Mid Trap 3+ 4- 4+     Lower Trap 3+ 4- 41   (blank fields were intentionally left blank)      Special Tests/Neurological Screen: 07/16/16   L R  L R   Varus Stress  (Lateral collateral ligament) (-) (-) Phalens (CTS) NT NT   Valgus Stress  (Medial collateral ligament) (-) (-) Finkelstein (paratenonitis in thumb) NT NT   Tinels ulnar nn (-) (-) TOS Screen NT NT               Modalities: None  Therapy Rationale: Other: educaiton over proper timeing for ince to not use more than     Assessment (response to treatment):  Pt is able to complete all of her daily chores at her PLOF with no reports of pain at this time and demonstrates full AROM and MMT. Pt is aware of weight restrictions at this time for not lifitng anything more than 5# at this time until released by MD. Reviewed full HEP program with education/instruction on how to progress exercises with sets, reps, hold times, and resistance to promote continued improvement for flexibility/strength/balance.  Special attention given to instruct patient to not push HEP to the point of pain and to contact our office if questions occur. Advised to follow-up with MD as prescribed/as needed.    Progress towards functional goals: --    Patient requires continued skilled care to: to improve LUE mobility to be able to carry objects for child care    Plan:  [place] to HEP. Pt no longer requires skilled intervention    Olen Pel PT, DPT, CSCS    [phone]    07/16/2016     Information below copied from evaluation as reference information unless noted/dated on the goal grid:    Functional Limitations (PLOF): unable to drive (able no issue), twisting the wrist hurts (able no issue), unable to carry objects with hand (able no issue), unable to sleep on the L side (able no issue), wakes up 1-2x/night (able no issue), unable to pull pants ups without pain  (able no issue), unable to put on deodorant without issues (able no issue), brushing the hair is difficulty (able no issue)     Plan Of Care: Body Mechanics Education, NMR, Proprioceptive Activites, Statistician, Instruction in HEP, Therapeutic Exercise, Therapeutic Activities, Balance/Gait training and Soft Tissue/Joint Mobilization STM to L UE, joint mobs as appropraite to L UE grade I-IV to L UE, manual stretching to L UE.      Frequency/Duration: 2 times a week for 12 sessions.        Certification Status Ends: 08/13/2016     Goals:  Date (Body Area, Impairment Goal, Functional   Activity, Target Performance) Time Frame Status Date/  Initial   06/15/16   Patient will demonstrate independence in prescribed HEP with proper form, sets and reps for safe discharge to an independent program.  12 sessions Goal met 07/16/16  [facility]   06/15/16   Improve elbow extension strength to 5/5 to transfer out of chair or push open doors with involved side.  12 sessions Goal met 07/16/16  [facility]   06/15/16   Improve elbow pronation and supination strength to 5/5 to be able to brush hair without pain.   12 sessions Goal met 07/16/16  [facility]   06/15/16   Increase grip strength to 70 pounds to allow patient to be able to pull on pants without pain.   12 sessions Goal met  07/16/16  [facility]   06/15/16   Improve elbow flexion strength to 5/5 to be able to carry purse/groceries in the L UE.   12 sessions Goal met  07/16/16  [facility]

## 2016-07-16 NOTE — PT/OT Plan of Care (Signed)
Discharge Status  [employer] Medicare Provider #: [phone]                Name: Kara Boyd Age: 36 y.o. Occupation: deskjob - resettles refugees SOC: 06/15/2016  Referring Physician: Verner Chol, MD MD recheck: Pending  DOS:   N/A  DOI: Onset of Problem / Injury: 06/08/16  Diagnosis (Treating/Medical): Diagnoses of Left elbow pain and Muscle weakness were pertinent to this visit.     ASSESSMENT: the patient is a 36 y.o. female presenting with L UE who completed Physical Therapy for the following:  Impairments: decreased L UE strength, decreased L AROM, decreased grip strength, edema of the L UE, postural deficits    Service Dates:   From: 06/15/2016  To: 07/16/2016  Visits from [facility]: 7    Subjective:  Kara Boyd  reports that she continues to do well and has not been having any pain in her L elbow. Pt states "My child tripped me the other day and I fell but it hurt then and then it went away". I really have no problems. I feel like I can be discharged. "I know not to lift any 5# until Im released by my MD".       OBJECTIVE:  Range of Motion: (degrees)    Elbow / Wrist L AROM  Initial  R AROM  Initial  L  AROM     Elbow flexion 137 p! 142 137p!   Elbow Extension 0 +8 0   Forearm Supination 81 p! 82     Forearm Pronation 83 p! 90    Wrist Flexion 58 86    Wrist Extension 60 63    Radial Deviation 20 p! 28     Ulnar Deviation 12 20    (blank fields were intentionally left blank)        Initial L Initial R LE Strength  MMT /5 L  06/30/16 L  07/07/16 L  2/15   4 p! 5 Elbow Flexion 5 5 5   4  p! 5 Elbow Extension 5p! 5 5   4+ p! 5 Forearm Supination 5p! 5 sore 5   4+ p! 5 Forearm Pronation 5p! 5 sore 5   4+ p! 5 Wrist Flexion 5 5 5    4+ 5 Wrist Extension 5 5 5    4+ p! 5 Radial Deviation 5 5 5    4+ p! 5 Ulnar Deviation 5 5 5      Mid Trap 3+ 4- 4+     Lower Trap 3+ 4- 41   (blank fields were intentionally left blank)      Special Tests/Neurological Screen: 07/16/16   L R  L R   Varus Stress  (Lateral collateral ligament) (-) (-)  Phalens (CTS) NT NT   Valgus Stress  (Medial collateral ligament) (-) (-) Finkelstein (paratenonitis in thumb) NT NT   Tinels ulnar nn (-) (-) TOS Screen NT NT     Goals:  Date (Body Area, Impairment Goal, Functional   Activity, Target Performance) Time Frame Status Date/  Initial   06/15/16   Patient will demonstrate independence in prescribed HEP with proper form, sets and reps for safe discharge to an independent program.  12 sessions Goal met 07/16/16  [facility] [place] [facility]   06/15/16   Improve elbow extension strength to 5/5 to transfer out of chair or push open doors with involved side.  12 sessions Goal met 07/16/16  [facility]   06/15/16   Improve elbow pronation and supination strength to 5/5 to  be able to brush hair without pain.   12 sessions Goal met 07/16/16  [facility]   06/15/16   Increase grip strength to 70 pounds to allow patient to be able to pull on pants without pain.   12 sessions Goal met  07/16/16  [facility]   06/15/16   Improve elbow flexion strength to 5/5 to be able to carry purse/groceries in the L UE.   12 sessions Goal met  07/16/16  [facility]        Outcomes: Eval: Quick Dash L Score: 68                              % Pain Score: 57% Rate Satisfaction with Current Function: 3/10              07/16/2016:Quick Dash L Score: 0% Pain Score: 0% Rate Satisfaction with Current Function: 10/10         Comments: Pt is able to complete all of her daily chores at her PLOF with no reports of pain at this time and demonstrates full AROM and MMT. Pt is aware of weight restrictions at this time for not lifitng anything more than 5# at this time until released by MD. Reviewed full HEP program with education/instruction on how to progress exercises with sets, reps, hold times, and resistance to promote continued improvement for flexibility/strength/balance.  Special attention given to instruct patient to not push HEP to the point of pain and to contact our office if questions occur. Advised to follow-up with MD as prescribed/as  needed.      Recommendations:   Discharge / Discontinue Physical/Occupational Therapy.  Reason for D/C:  Goals met        Signature: Olen Pel PT, DPT, CSCS   [place] [address]   Date: 07/16/2016          Patient Name: Kara Boyd  MRN: [id number]

## 2016-07-16 NOTE — PT/OT Exercise Plan (Signed)
Name: Kara Boyd "Kara Boyd"   Referring Physician: Verner Chol, MD  Diagnosis:     ICD-10-CM    1. Left elbow pain M25.522    2. Muscle weakness M62.81         Precautions:  PWB of the L UE Date of Surgery:   -- MD Follow-up: Pending - forgot exact date          Exercise Flow Sheet    Exercise Specifics 06/23/16 06/26/16 06/30/16 07/03/16 07/07/16         UBE    Held/fear UBE  3'3'  120 rpm  aw UBE  3'/3'  MSP UBE  3'/3'  AH UBE  3  3'/3'  AH           Wrist flex/ext  Verbal review  MSP -- -- Eccentric 2#  2x15 L  AH HEP         Wrist RD/UD    Verbal review  MSP -- -- RD Eccentric  1#   2x15  AH HEP         PRE scap, flex    10 x 2 and abd  Ea  MSP 2x10 ea  aw #1  2 x 10 ea  MSP -->  time 2#  2x10  AH           Prone row/H Abd    10 x 2  MSP 2x10ea  aw #1  10 x 2  MSP 2# ea  Row, ext, H abduction  2x15 ea  AH HEP         putty    2'  MSP 2'  aw 2'  MSP -- DWS  3x30 sec  AH           Gripper  --- -- -- -- ABC at wall  Flex x1 L  AH           digiflex  R digi flex  2' MSP x2'  Red  aw Black  2'  MSP -- Serratus punch  10x10 sec 5#  AH           Wrist maze  3'  MSP 3'  aw --   -- Horizontal wall walk no TB  x2 hall  AH               Steering wheel   2#  30X  MSP 2#  x30  Ah 2#  x30  AH                            Home Exercise Program    Review HEP -  Updated HEP  AH  Updated and reviewed  MSP        (Initials = supervised exercise by clinician)    Access Code: [email or web address]   URL: [email or web address]   Date: 07/16/2016   Prepared by: Olen Pel     Exercises   Seated Wrist Flexion Stretch - 3 reps - 1 sets - 30 hold - 2x daily - 7x weekly   Seated Wrist Extension Stretch - 3 reps - 1 sets - 30 hold - 2x daily - 7x weekly   Prone Shoulder Row - 10 reps - 2 sets - 3 hold - 1x daily - 4x weekly   Prone Shoulder Extension - Single Arm with Dumbbell - 10 reps - 2 sets - 3 hold - 1x daily - 4x weekly   Prone Single Arm Shoulder  Horizontal Abduction with Dumbbell - 10 reps - 2 sets - 3 hold - 1x daily - 4x  weekly   Seated Wrist Flexion with Dumbbell - 10 reps - 2 sets - 3 hold - 1x daily - 4x weekly   Seated Wrist Radial Deviation with Dumbbell - 10 reps - 3 sets - 3 hold - 1x daily - 4x weekly   Prone Single Arm Shoulder Y with Dumbbell - 10 reps - 3 sets - 1x daily - 4x weekly   Prone Scapular Retraction Arms at Side - 10 reps - 3 sets - 1x daily - 4x weekly   Prone Shoulder Row in Abduction - 10 reps - 3 sets - 1x daily - 4x weekly

## 2016-07-17 ENCOUNTER — Inpatient Hospital Stay: Payer: Commercial Managed Care - POS

## 2016-07-20 ENCOUNTER — Inpatient Hospital Stay: Payer: Commercial Managed Care - POS

## 2016-07-23 ENCOUNTER — Ambulatory Visit (INDEPENDENT_AMBULATORY_CARE_PROVIDER_SITE_OTHER): Payer: Commercial Managed Care - POS | Admitting: Orthopaedic Surgery

## 2016-07-24 ENCOUNTER — Inpatient Hospital Stay: Payer: Commercial Managed Care - POS

## 2016-07-28 ENCOUNTER — Inpatient Hospital Stay: Payer: Commercial Managed Care - POS

## 2016-07-30 ENCOUNTER — Encounter (INDEPENDENT_AMBULATORY_CARE_PROVIDER_SITE_OTHER): Payer: Self-pay

## 2016-07-30 ENCOUNTER — Inpatient Hospital Stay: Payer: Commercial Managed Care - POS

## 2016-07-30 ENCOUNTER — Ambulatory Visit (INDEPENDENT_AMBULATORY_CARE_PROVIDER_SITE_OTHER): Payer: Commercial Managed Care - POS

## 2016-07-30 ENCOUNTER — Encounter (INDEPENDENT_AMBULATORY_CARE_PROVIDER_SITE_OTHER): Payer: Self-pay | Admitting: Orthopaedic Surgery

## 2016-07-30 ENCOUNTER — Ambulatory Visit (INDEPENDENT_AMBULATORY_CARE_PROVIDER_SITE_OTHER): Payer: Commercial Managed Care - POS | Admitting: Orthopaedic Surgery

## 2016-07-30 VITALS — Ht 69.0 in | Wt 243.0 lb

## 2016-07-30 DIAGNOSIS — S42402D Unspecified fracture of lower end of left humerus, subsequent encounter for fracture with routine healing: Secondary | ICD-10-CM

## 2016-07-30 NOTE — Progress Notes (Signed)
Patient returns follow-up for her left radial head fracture.  She looks great today.  She has minimal pain in her left elbow.  Physical therapist is going well.    Physical examination left elbow shows no gross formed.  No tenderness to palpation over the radial head.  Full range of motion.  Her elbow with flexion, extension, pronation, supination.  She is otherwise neurovascular intact.    Imaging studies: Left elbow x-rays AP, lateral, oblique views were reviewed.  They demonstrate to begin change with a radial head fracture.  No obvious healing is noted, however, no  change in alignment is noted as well.    Impression: Left radial head fracture.    Plan: She looks great today.  She can return back to activities as tolerated outside of lifting more than 5 pounds and I left side for the next 2 months.  I will see her back on an as-needed basis.  All questions were answered

## 2016-07-31 ENCOUNTER — Inpatient Hospital Stay: Payer: Commercial Managed Care - POS

## 2017-04-11 LAB — GLUCOSE, POCT (MANUAL RESULT ENTRY): POC Glucose: 108 mg/dl — AB (ref 70–99)

## 2018-10-09 ENCOUNTER — Emergency Department
Admission: EM | Admit: 2018-10-09 | Discharge: 2018-10-09 | Disposition: A | Discharge status: Home / Self Care | Payer: Commercial Managed Care - POS | Attending: Emergency Medicine | Admitting: Emergency Medicine

## 2018-10-09 DIAGNOSIS — E875 Hyperkalemia: Secondary | ICD-10-CM | POA: Insufficient documentation

## 2018-10-09 DIAGNOSIS — E860 Dehydration: Secondary | ICD-10-CM | POA: Insufficient documentation

## 2018-10-09 DIAGNOSIS — F17200 Nicotine dependence, unspecified, uncomplicated: Secondary | ICD-10-CM | POA: Insufficient documentation

## 2018-10-09 DIAGNOSIS — E119 Type 2 diabetes mellitus without complications: Secondary | ICD-10-CM | POA: Insufficient documentation

## 2018-10-09 DIAGNOSIS — Z7984 Long term (current) use of oral hypoglycemic drugs: Secondary | ICD-10-CM | POA: Insufficient documentation

## 2018-10-09 DIAGNOSIS — D72829 Elevated white blood cell count, unspecified: Secondary | ICD-10-CM | POA: Insufficient documentation

## 2018-10-09 DIAGNOSIS — R112 Nausea with vomiting, unspecified: Secondary | ICD-10-CM

## 2018-10-09 LAB — CBC AND DIFFERENTIAL
Absolute NRBC: 0 10*3/uL (ref 0.00–0.00)
Basophils Absolute Automated: 0.12 10*3/uL — ABNORMAL HIGH (ref 0.00–0.08)
Basophils Automated: 0.6 %
Eosinophils Absolute Automated: 0.11 10*3/uL (ref 0.00–0.44)
Eosinophils Automated: 0.5 %
Hematocrit: 46 % — ABNORMAL HIGH (ref 34.7–43.7)
Hgb: 15.5 g/dL — ABNORMAL HIGH (ref 11.4–14.8)
Immature Granulocytes Absolute: 0.13 10*3/uL — ABNORMAL HIGH (ref 0.00–0.07)
Immature Granulocytes: 0.6 %
Lymphocytes Absolute Automated: 2.25 10*3/uL (ref 0.42–3.22)
Lymphocytes Automated: 10.8 %
MCH: 30.2 pg (ref 25.1–33.5)
MCHC: 33.7 g/dL (ref 31.5–35.8)
MCV: 89.5 fL (ref 78.0–96.0)
MPV: 8.8 fL — ABNORMAL LOW (ref 8.9–12.5)
Monocytes Absolute Automated: 2.1 10*3/uL — ABNORMAL HIGH (ref 0.21–0.85)
Monocytes: 10.1 %
Neutrophils Absolute: 16.1 10*3/uL — ABNORMAL HIGH (ref 1.10–6.33)
Neutrophils: 77.4 %
Nucleated RBC: 0 /100 WBC (ref 0.0–0.0)
Platelets: 416 10*3/uL — ABNORMAL HIGH (ref 142–346)
RBC: 5.14 10*6/uL — ABNORMAL HIGH (ref 3.90–5.10)
RDW: 12 % (ref 11–15)
WBC: 20.81 10*3/uL — ABNORMAL HIGH (ref 3.10–9.50)

## 2018-10-09 LAB — URINALYSIS WITH REFLEX TO MICROSCOPIC EXAM IF INDICATED
Bilirubin, UA: NEGATIVE
Glucose, UA: >=1000 — AB
Ketones UA: 40 — AB
Leukocyte Esterase, UA: NEGATIVE
Nitrite, UA: NEGATIVE
Protein, UR: NEGATIVE
RBC, UA: 0 /HPF (ref 0–5)
Specific Gravity UA: 1.01 (ref 1.001–1.035)
Urine pH: 7 (ref 5.0–8.0)
Urobilinogen, UA: 0.2 mg/dL (ref 0.2–2.0)

## 2018-10-09 LAB — HEPATIC FUNCTION PANEL
ALT: 14 U/L (ref 0–55)
AST (SGOT): 19 U/L (ref 5–34)
Albumin/Globulin Ratio: 1.7 (ref 0.9–2.2)
Albumin: 5 g/dL (ref 3.5–5.0)
Alkaline Phosphatase: 64 U/L (ref 37–106)
Bilirubin Direct: 0.4 mg/dL (ref 0.0–0.5)
Bilirubin Indirect: 0.9 mg/dL (ref 0.2–1.0)
Bilirubin, Total: 1.3 mg/dL — ABNORMAL HIGH (ref 0.2–1.2)
Globulin: 2.9 g/dL (ref 2.0–3.6)
Protein, Total: 7.9 g/dL (ref 6.0–8.3)

## 2018-10-09 LAB — GFR
EGFR: 55.1
EGFR: >60

## 2018-10-09 LAB — BASIC METABOLIC PANEL
Anion Gap: 14 (ref 5.0–15.0)
Anion Gap: 13 (ref 5.0–15.0)
BUN: 10 mg/dL (ref 7.0–19.0)
BUN: 9 mg/dL (ref 7.0–19.0)
CO2: 19 mEq/L — ABNORMAL LOW (ref 22–29)
CO2: 22 mEq/L (ref 22–29)
Calcium: 10.3 mg/dL (ref 8.5–10.5)
Calcium: 9.1 mg/dL (ref 8.5–10.5)
Chloride: 103 mEq/L (ref 100–111)
Chloride: 108 mEq/L (ref 100–111)
Creatinine: 1.3 mg/dL — ABNORMAL HIGH (ref 0.6–1.0)
Creatinine: 1.1 mg/dL — ABNORMAL HIGH (ref 0.6–1.0)
Glucose: 190 mg/dL — ABNORMAL HIGH (ref 70–100)
Glucose: 164 mg/dL — ABNORMAL HIGH (ref 70–100)
Potassium: 6.5 mEq/L (ref 3.5–5.1)
Potassium: 4.9 mEq/L (ref 3.5–5.1)
Sodium: 136 mEq/L (ref 136–145)
Sodium: 143 mEq/L (ref 136–145)

## 2018-10-09 LAB — ECG 12-LEAD
Atrial Rate: 66 {beats}/min
P Axis: 40 degrees
P-R Interval: 134 ms
Q-T Interval: 428 ms
QRS Duration: 96 ms
QTC Calculation (Bezet): 448 ms
R Axis: 27 degrees
T Axis: 16 degrees
Ventricular Rate: 66 {beats}/min

## 2018-10-09 LAB — HCG, SERUM, QUALITATIVE: Hcg Qualitative: NEGATIVE

## 2018-10-09 LAB — LIPASE: Lipase: 19 U/L (ref 8–78)

## 2018-10-09 LAB — TROPONIN I: Troponin I: <0.01 ng/mL (ref 0.00–0.05)

## 2018-10-09 MED ORDER — INSULIN REGULAR HUMAN 100 UNIT/ML IJ SOLN
5.00 [IU] | Freq: Once | INTRAMUSCULAR | Status: AC
Start: 2018-10-09 — End: 2018-10-09
  Administered 2018-10-09: 5 [IU] via INTRAVENOUS
  Filled 2018-10-09: qty 15

## 2018-10-09 MED ORDER — HALOPERIDOL LACTATE 5 MG/ML IJ SOLN
5.00 mg | Freq: Once | INTRAMUSCULAR | Status: AC
Start: 2018-10-09 — End: 2018-10-09
  Administered 2018-10-09: 5 mg via INTRAVENOUS
  Filled 2018-10-09: qty 1

## 2018-10-09 MED ORDER — SODIUM CHLORIDE 0.9 % IV BOLUS
1000.0000 mL | Freq: Once | INTRAVENOUS | Status: AC
Start: 2018-10-09 — End: 2018-10-09
  Administered 2018-10-09: 1000 mL via INTRAVENOUS

## 2018-10-09 MED ORDER — SODIUM BICARBONATE 8.4 % IV SOLN
50.00 meq | Freq: Once | INTRAVENOUS | Status: AC
Start: 2018-10-09 — End: 2018-10-09
  Administered 2018-10-09: 50 meq via INTRAVENOUS
  Filled 2018-10-09: qty 50

## 2018-10-09 MED ORDER — ONDANSETRON 4 MG PO TBDP
4.00 mg | ORAL_TABLET | Freq: Four times a day (QID) | ORAL | 0 refills | Status: DC | PRN
Start: 2018-10-09 — End: 2020-02-26

## 2018-10-09 MED ORDER — SODIUM CHLORIDE 0.9 % IV BOLUS
1000.00 mL | Freq: Once | INTRAVENOUS | Status: AC
Start: 2018-10-09 — End: 2018-10-09
  Administered 2018-10-09: 1000 mL via INTRAVENOUS

## 2018-10-09 MED ORDER — CALCIUM GLUCONATE 10 % IV SOLN
1.00 g | Freq: Once | INTRAVENOUS | Status: AC
Start: 2018-10-09 — End: 2018-10-09
  Administered 2018-10-09: 1 g via INTRAVENOUS
  Filled 2018-10-09: qty 10

## 2018-10-09 MED ORDER — DIPHENHYDRAMINE HCL 50 MG/ML IJ SOLN
50.00 mg | Freq: Once | INTRAMUSCULAR | Status: AC
Start: 2018-10-09 — End: 2018-10-09
  Administered 2018-10-09: 50 mg via INTRAVENOUS
  Filled 2018-10-09: qty 1

## 2018-10-09 MED ORDER — DEXTROSE 50 % IV SOLN
50.00 g | Freq: Once | INTRAVENOUS | Status: AC
Start: 2018-10-09 — End: 2018-10-09
  Administered 2018-10-09: 50 g via INTRAVENOUS
  Filled 2018-10-09: qty 50

## 2018-10-09 NOTE — ED Triage Notes (Signed)
Nausea, vomiting and diarrhea, onset today. No fever. No SOB

## 2018-10-09 NOTE — ED Provider Notes (Signed)
EMERGENCY DEPARTMENT NOTE    Physician/Midlevel provider first contact with patient: 10/09/18 1524         HISTORY OF PRESENT ILLNESS   Historian: patient  Translator Used: no    38 y.o. female diabetic, daily marijuana smoker, hx gastritis presents with nausea, vomiting, diarrhea.    1. Location of symptoms: nausea, vomiting, diarrhea  2. Onset of symptoms: this morning  3. What was patient doing when symptoms started (Context): ate an omlet, started vomiting, hx of similar symptoms, smoke marijuana daily, hx of gastritis (tells me mild, diagnosed on egd)  4. Severity: severe  5. Timing: acute onset, constant  6. Activities that worsen symptoms: none  7. Activities that improve symptoms: none  8. Quality: nausea, vomiting- about 5 x, no blood in vomitus, diarrhea- about 3 x, no blood/black   9. Radiation of symptoms: none  10. Associated signs and Symptoms: cramping abdominal pain   11. Are symptoms worsening? yes          MEDICAL HISTORY     Past Medical History:  Past Medical History:   Diagnosis Date   . Anxiety    . Depression    . Diabetes mellitus        Past Surgical History:  Past Surgical History:   Procedure Laterality Date   . CESAREAN SECTION     . CHOLECYSTECTOMY         Social History:  Social History     Socioeconomic History   . Marital status: Married     Spouse name: Not on file   . Number of children: Not on file   . Years of education: Not on file   . Highest education level: Not on file   Occupational History   . Not on file   Social Needs   . Financial resource strain: Not on file   . Food insecurity:     Worry: Not on file     Inability: Not on file   . Transportation needs:     Medical: Not on file     Non-medical: Not on file   Tobacco Use   . Smoking status: Current Some Day Smoker   . Smokeless tobacco: Current User   Substance and Sexual Activity   . Alcohol use: Yes   . Drug use: Yes     Types: Marijuana   . Sexual activity: Not on file   Lifestyle   . Physical activity:     Days per  week: Not on file     Minutes per session: Not on file   . Stress: Not on file   Relationships   . Social connections:     Talks on phone: Not on file     Gets together: Not on file     Attends religious service: Not on file     Active member of club or organization: Not on file     Attends meetings of clubs or organizations: Not on file     Relationship status: Not on file   . Intimate partner violence:     Fear of current or ex partner: Not on file     Emotionally abused: Not on file     Physically abused: Not on file     Forced sexual activity: Not on file   Other Topics Concern   . Not on file   Social History Narrative   . Not on file       Family History:  No  family history on file.    Outpatient Medication:  Previous Medications    BUPROPION XL (WELLBUTRIN XL) 300 MG 24 HR TABLET    Take 300 mg by mouth daily.    BUSPIRONE (BUSPAR) 30 MG TABLET    Take 30 mg by mouth 2 (two) times daily.    CANAGLIFLOZIN (INVOKANA) 100 MG TAB TABLET    Take 100 mg by mouth daily.    DULAGLUTIDE (TRULICITY SC)    Inject into the skin.    ESCITALOPRAM (LEXAPRO) 5 MG TABLET    Take 5 mg by mouth daily.    LINAGLIPTIN (TRADJENTA PO)    Take by mouth.    LORAZEPAM (ATIVAN) 1 MG TABLET    Take 1 mg by mouth every 6 (six) hours as needed for Anxiety.    TRAMADOL (ULTRAM) 50 MG TABLET    Take 2 tablets (100 mg total) by mouth every 6 (six) hours as needed for Pain.    VORTIOXETINE HBR (TRINTELLIX) 20 MG TAB    Take by mouth.       Allergies:  Allergies   Allergen Reactions   . Codeine        REVIEW OF SYSTEMS   Review of Systems   Constitutional: Negative for chills and fever.   Gastrointestinal: Positive for abdominal pain, diarrhea, nausea and vomiting. Negative for blood in stool and melena.   All other systems reviewed and are negative.        PHYSICAL EXAM     Vitals:    10/09/18 1808   BP: 106/72   Pulse: 80   Resp: 15   Temp:    SpO2: 100%       Nursing note and vitals reviewed.    Constitutional: non-toxic, distressed  Head:  Atraumatic.  Eyes: PERRL. EOMI. No scleral icterus.  ENT: Mucous membranes are moist and intact. Oropharynx is clear. Patent airway.  Neck: Supple. No cervical lymphadenopathy.  Cardiovascular: Regular rate. Regular rhythm. No murmurs, rubs, or gallops.  Pulmonary/Chest: No evidence of respiratory distress. Clear to auscultation bilaterally. No wheezing, rales or rhonchi.   GI: Soft, non-distended abdomen. No tenderness to palpation of abdomen.  Extremities: No edema. No deformity.  Skin: No rash.   Neurological: Awake, alert and oriented x 3. CN II-XII intact.   Psychiatric: Appropriate affect. Appropriate mood. Appropriate behavior.    MEDICAL DECISION MAKING   Nausea, vomiting, diarrhea, cramping abd pain. Hx of chronic daily marijauna use. Has had similar symptoms before. 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 Plan to given haldol, benadryl following ekg to check qtc.    Reviewed labs. WBC 20,000. This is likely a stress response. She denies pain at this point. States it wasn't really pain that brought her to ED, it was nausea/vomiting. Doubt any infectious etiology. Hgb/Hct both elevated. Hemoconcentration is evident to me. Giving 2 L saline bolus to address. BS is elevated mildly at 190. Known diabetic. This doesn't surprise me. Cr mildly elevated at 1.3. suspect this is due to dehydration. Potassium is 6.5. this is surprising. This could be real. No EKG changes. Giving hyperkalemia cocktail (calcium gluconate, insulin, glucose, bicarb). Will recheck potassium after fluid bolus complete. Bicarb only mildly low at 19. No signifant anion gap. Doubt dka. Hasn't given urine sample. Will check for ketones. Lipase wnl. This isn't pancreatitis. lft's okay. Obtained trop given t wave changes (which appear same as prior ekg). Trop negative. This isn't acs.     Reviewed 2nd set of labs. Potassium normal. Cr  improved. I doubt the 1st set of labs was accurate. Patient is taking po hydration. She feels much  better, just tired and cold (she got 2 L of room temp saline). I am discharging home. Instructed immediate return for return of vomiting, fever, abdominal pain. I discussed plan with husband patrick on phone as well. We discussed my suspicions that this is marijuana hyperemesis.    DISCUSSION      Vital Signs: Reviewed the patient?s vital signs.   Nursing Notes: Reviewed and utilized available nursing notes.  Medical Records Reviewed: Reviewed available past medical records.  Counseling: The emergency provider has spoken with the patient and discussed today?s findings, in addition to providing specific details for the plan of care.  Questions are answered and there is agreement with the plan.    IMAGING STUDIES    The following imaging studies were reviewed by the Emergency Medicine Physician.  For full imaging study results please see chart.    No orders to display       CARDIAC STUDIES     The following cardiac studies were independently interpreted by the Emergency Medicine Physician. For full cardiac study results please see chart     EKG Interpretation:   Signed and interpreted by ED Physician   Time Interpreted: 1545  Comparison: 10/26/1998  Rate: 66  Rhythm: nsr   Axis:    Intervals: normal  Blocks: none  ST segments: t wave inversions in inferior and anterior leads unchanged compared to prior ekg  Interpretation: abnormal EKG    PULSE OXIMETRY        EMERGENCY DEPT. MEDICATIONS      ED Medication Orders (From admission, onward)    Start Ordered     Status Ordering Provider    10/09/18 1640 10/09/18 1639  sodium chloride 0.9 % bolus 1,000 mL  Once     Route: Intravenous  Ordered Dose: 1,000 mL     Last MAR action:  New Bag Ladoris Gene [facility]    10/09/18 1640 10/09/18 1639  calcium GLUConate 10 % injection 1 g  Once     Route: Intravenous  Ordered Dose: 1 g     Last MAR action:  Given Ladoris Gene [facility]    10/09/18 1640 10/09/18 1639  sodium bicarbonate 8.4 % injection 50 mEq  Once     Route:  Intravenous  Ordered Dose: 50 mEq     Last MAR action:  Given Ladoris Gene [facility]    10/09/18 1640 10/09/18 1639  insulin regular (HumuLIN R,NovoLIN R) injection 5 Units  Once     Route: Intravenous  Ordered Dose: 5 Units     Last MAR action:  Given Ladoris Gene [facility]    10/09/18 1640 10/09/18 1639  dextrose 50 % bolus 50 g  Once     Route: Intravenous  Ordered Dose: 50 g     Last MAR action:  Given Ladoris Gene [facility]    10/09/18 1547 10/09/18 1546  haloperidol lactate (HALDOL) injection 5 mg  Once     Route: Intravenous  Ordered Dose: 5 mg     Last MAR action:  Given Ladoris Gene [facility]    10/09/18 1547 10/09/18 1546  diphenhydrAMINE (BENADRYL) injection 50 mg  Once     Route: Intravenous  Ordered Dose: 50 mg     Last MAR action:  Given Ladoris Gene [facility]    10/09/18 1527 10/09/18 1526  sodium chloride 0.9 % bolus 1,000 mL  Once  Route: Intravenous  Ordered Dose: 1,000 mL     Last MAR action:  New Bag Jerin Franzel [facility]          LABORATORY RESULTS    Ordered and independently interpreted AVAILABLE laboratory tests. Please see results section in chart for full details.  Results for orders placed or performed during the hospital encounter of 10/09/18   Basic Metabolic Panel   Result Value Ref Range    Glucose 190 (H) 70 - 100 mg/dL    BUN 78.2 7.0 - 95.6 mg/dL    Creatinine 1.3 (H) 0.6 - 1.0 mg/dL    Calcium 21.3 8.5 - 08.6 mg/dL    Sodium 578 469 - 629 mEq/L    Potassium 6.5 (HH) 3.5 - 5.1 mEq/L    Chloride 103 100 - 111 mEq/L    CO2 19 (L) 22 - 29 mEq/L    Anion Gap 14.0 5.0 - 15.0   CBC and differential   Result Value Ref Range    WBC 20.81 (H) 3.10 - 9.50 x10 3/uL    Hgb 15.5 (H) 11.4 - 14.8 g/dL    Hematocrit 52.8 (H) 34.7 - 43.7 %    Platelets 416 (H) 142 - 346 x10 3/uL    RBC 5.14 (H) 3.90 - 5.10 x10 6/uL    MCV 89.5 78.0 - 96.0 fL    MCH 30.2 25.1 - 33.5 pg    MCHC 33.7 31.5 - 35.8 g/dL    RDW 12 11 - 15 %    MPV 8.8 (L) 8.9 - 12.5 fL    Neutrophils 77.4 None %     Lymphocytes Automated 10.8 None %    Monocytes 10.1 None %    Eosinophils Automated 0.5 None %    Basophils Automated 0.6 None %    Immature Granulocyte 0.6 None %    Nucleated RBC 0.0 0.0 - 0.0 /100 WBC    Neutrophils Absolute 16.10 (H) 1.10 - 6.33 x10 3/uL    Abs Lymph Automated 2.25 0.42 - 3.22 x10 3/uL    Abs Mono Automated 2.10 (H) 0.21 - 0.85 x10 3/uL    Abs Eos Automated 0.11 0.00 - 0.44 x10 3/uL    Absolute Baso Automated 0.12 (H) 0.00 - 0.08 x10 3/uL    Absolute Immature Granulocyte 0.13 (H) 0.00 - 0.07 x10 3/uL    Absolute NRBC 0.00 0.00 - 0.00 x10 3/uL   Lipase   Result Value Ref Range    Lipase 19 8 - 78 U/L   Hepatic function panel (LFT)   Result Value Ref Range    Bilirubin, Total 1.3 (H) 0.2 - 1.2 mg/dL    Bilirubin, Direct 0.4 0.0 - 0.5 mg/dL    Bilirubin, Indirect 0.9 0.2 - 1.0 mg/dL    AST (SGOT) 19 5 - 34 U/L    ALT 14 0 - 55 U/L    Alkaline Phosphatase 64 37 - 106 U/L    Protein, Total 7.9 6.0 - 8.3 g/dL    Albumin 5.0 3.5 - 5.0 g/dL    Globulin 2.9 2.0 - 3.6 g/dL    Albumin/Globulin Ratio 1.7 0.9 - 2.2   Beta HCG, Qual, Serum   Result Value Ref Range    Hcg Qualitative Negative Negative   UA with reflex to micro (all hospital ED's and [facility], pts 3 + yrs)   Result Value Ref Range    Urine Type CC     Color, UA Yellow Colorless - Yellow  Clarity, UA Clear Clear - Hazy    Specific Gravity UA 1.010 1.001 - 1.035    Urine pH 7.0 5.0 - 8.0    Leukocyte Esterase, UA Negative Negative    Nitrite, UA Negative Negative    Protein, UR Negative Negative    Glucose, UA >=1,000 (A) Negative    Ketones UA 40 (A) Negative    Urobilinogen, UA 0.2 0.2 - 2.0 mg/dL    Bilirubin, UA Negative Negative    Blood, UA Large (A) Negative    RBC, UA 0 -2 0 - 5 /hpf    WBC, UA 0 - 5 0 - 5 /hpf    Squamous Epithelial Cells, Urine 0 - 5 0 - 25 /hpf    Urine Amorphous Occasional Occasional /hpf   GFR   Result Value Ref Range    EGFR 55.1    Troponin I   Result Value Ref Range    Troponin I <0.01 0.00 - 0.05  ng/mL   Basic Metabolic Panel   Result Value Ref Range    Glucose 164 (H) 70 - 100 mg/dL    BUN 9.0 7.0 - 57.8 mg/dL    Creatinine 1.1 (H) 0.6 - 1.0 mg/dL    Calcium 9.1 8.5 - 46.9 mg/dL    Sodium 629 528 - 413 mEq/L    Potassium 4.9 3.5 - 5.1 mEq/L    Chloride 108 100 - 111 mEq/L    CO2 22 22 - 29 mEq/L    Anion Gap 13.0 5.0 - 15.0   GFR   Result Value Ref Range    EGFR >60.0    ECG 12 Lead   Result Value Ref Range    Ventricular Rate 66 BPM    Atrial Rate 66 BPM    P-R Interval 134 ms    QRS Duration 96 ms    Q-T Interval 428 ms    QTC Calculation (Bezet) 448 ms    P Axis 40 degrees    R Axis 27 degrees    T Axis 16 degrees       ATTESTATIONS      Physician Attestation: Darlyn Read MD, have been the primary provider for Kara Boyd during this Emergency Dept visit and have reviewed the chart for accuracy and agree with its content.     DIAGNOSIS      Diagnosis:  Final diagnoses:   Non-intractable vomiting with nausea, unspecified vomiting type   Dehydration   Hyperkalemia   Leukocytosis, unspecified type       Disposition:  ED Disposition     ED Disposition Condition Date/Time Comment    Discharge  Sun Oct 09, 2018  7:13 PM Kara Boyd discharge to home/self care.    Condition at disposition: Stable          Prescriptions:  Patient's Medications   New Prescriptions    ONDANSETRON (ZOFRAN-ODT) 4 MG DISINTEGRATING TABLET    Take 1 tablet (4 mg total) by mouth every 6 (six) hours as needed for Nausea   Previous Medications    BUPROPION XL (WELLBUTRIN XL) 300 MG 24 HR TABLET    Take 300 mg by mouth daily.    BUSPIRONE (BUSPAR) 30 MG TABLET    Take 30 mg by mouth 2 (two) times daily.    CANAGLIFLOZIN (INVOKANA) 100 MG TAB TABLET    Take 100 mg by mouth daily.    DULAGLUTIDE (TRULICITY SC)    Inject into the skin.  ESCITALOPRAM (LEXAPRO) 5 MG TABLET    Take 5 mg by mouth daily.    LINAGLIPTIN (TRADJENTA PO)    Take by mouth.    LORAZEPAM (ATIVAN) 1 MG TABLET    Take 1 mg by mouth every 6 (six)  hours as needed for Anxiety.    TRAMADOL (ULTRAM) 50 MG TABLET    Take 2 tablets (100 mg total) by mouth every 6 (six) hours as needed for Pain.    VORTIOXETINE HBR (TRINTELLIX) 20 MG TAB    Take by mouth.   Modified Medications    No medications on file   Discontinued Medications    No medications on file          Marland Mcalpine, MD  10/09/18 [phone]

## 2018-10-09 NOTE — ED Notes (Signed)
Pt is aware that she needs to call her family member/boyfriend.

## 2018-11-27 ENCOUNTER — Encounter (INDEPENDENT_AMBULATORY_CARE_PROVIDER_SITE_OTHER): Payer: Self-pay

## 2018-11-27 ENCOUNTER — Ambulatory Visit (INDEPENDENT_AMBULATORY_CARE_PROVIDER_SITE_OTHER): Payer: Commercial Managed Care - POS | Admitting: Family Medicine

## 2018-11-27 ENCOUNTER — Ambulatory Visit (INDEPENDENT_AMBULATORY_CARE_PROVIDER_SITE_OTHER): Payer: Commercial Managed Care - POS

## 2018-11-27 VITALS — BP 135/81 | HR 86 | Temp 98.4°F | Resp 14 | Ht 68.0 in | Wt 218.0 lb

## 2018-11-27 DIAGNOSIS — M25561 Pain in right knee: Secondary | ICD-10-CM

## 2018-11-27 DIAGNOSIS — M222X1 Patellofemoral disorders, right knee: Secondary | ICD-10-CM

## 2018-11-27 NOTE — Patient Instructions (Signed)
Understanding Patellofemoral Syndrome    Patellofemoral syndrome is a condition that causes pain on the front of the knee. The large bones of the upper and lower leg meet at the knee. This joint also includes a small triangle-shaped bone that rests on top of the leg bones. This is the kneecap (patella). Patellofemoral refers to the patella and the thighbone (femur). These bones are surrounded by connective tissue and muscles. Patellofemoral pain is believed to come from stress on the tissues of and around the knee. It's sometimes called runner's knee or jumper's knee because it's common in people who take part in sports.   What causes patellofemoral syndrome?  No single cause for patellofemoral pain has been found. But many things are likely to contribute to this type of knee pain. These include:    Actions that put repeated stress on the knee, such as running and squatting   Overtraining at a sport   Weak hip or thigh muscles   Normal variations in the way body parts fit together   Poor form during activities that stress the knee, such as running   A fall or blow to the knee  Symptoms of patellofemoral syndrome  Pain is a common symptom. It's often on the front of the knee, but can be around the kneecap. Pain can occur at certain times, such as when you are:    Running   Sitting for a long time with your knees bent, such as at a movie   Walking up or down stairs   Squatting  Other symptoms may include:   A feeling of the knee catching or locking   A grinding or crackling noise in your knee  Treatment for patellofemoral syndrome  Treatment focuses on reducing pain and avoiding further injury. Treatments may include:   Rest your leg. This gives your knee time to recover. You may need to avoid or change the activity that caused the problem, such as not running for a while.   Prescription or over-the-counter medicines. These help reduce inflammation, swelling, and pain. NSAIDs (nonsteroidal  anti-inflammatory drugs) are the most common medicines used. Medicines may be prescribed or bought over the counter. They may be given as pills. Or they may be put on the skin as a gel, cream, or patch.   Cold packs. These help reduce pain.   Stretches and other exercises. These can improve balance, flexibility, and strength.   A shoe insert (orthotic). This can make your knee more stable.   Elastic tape or a brace. These can make your knee more stable.   Physical therapy. This may include exercises or other treatments.   Surgery. In rare cases, if other treatments don't relieve symptoms, you may need surgery.  Possible complications of patellofemoral syndrome  If you don't give your knee time to heal, symptoms may return or get worse. Follow your healthcare provider's instructions on resting and treating your knee.   When to call your healthcare provider  Call your healthcare provider right away if you have any of these:   Fever of 100.4F (38C) or higher, or as directed by your provider   Chills   Pain that gets worse   Symptoms that don't get better, or get worse   New symptoms  [employer] last reviewed this educational content on 10/30/2017   2000-2020 [employer] [address], [place] [address]. All rights reserved. This information is not intended as a substitute for professional medical care. Always follow your   healthcare professional's instructions.

## 2018-11-27 NOTE — Progress Notes (Signed)
[facility]  PROGRESS NOTE     Patient: Kara Boyd   Date of Service: 11/27/2018   MRN: [id number]       Kara Boyd is a 38 y.o. female      HISTORY     Chief Complaint   Patient presents with   . Knee Pain     right knee pain started 1 week ago. Getting worse RICE is not working.  has recently started running.         HPI  38yo bf used to play competitive tennis and basketball in high school and college, no prior injury however, presents with about 1 week of anterior right knee pain (pretty much all the time), stiffness, can be uncomfortable at night, can be worse with prolonged sitting, can feel it when going to take a step will feel a sharp pain on outside of knee. Started jogging about 1 month ago (after purposeful 20+# weight loss), 1 mile 3x/week, no other exercise. Bought new running shoes when pain started. No noticeable swelling.    Review of Systems   Constitutional: Negative for fever and unexpected weight change.   Cardiovascular: Negative for leg swelling.   Musculoskeletal: Positive for arthralgias. Negative for gait problem and joint swelling.   Skin: Negative for color change.       History:  Past Medical History:   Diagnosis Date   . Anxiety    . Depression    . Diabetes mellitus        Past Surgical History:   Procedure Laterality Date   . CESAREAN SECTION     . CHOLECYSTECTOMY         History reviewed. No pertinent family history.    Social History     Tobacco Use   . Smoking status: Former Games developer   . Smokeless tobacco: Current User   Substance Use Topics   . Alcohol use: Yes     Alcohol/week: 2.0 standard drinks     Types: 2 Cans of beer per week   . Drug use: Yes     Types: Marijuana       History reviewed.        Current Outpatient Medications:   .  buPROPion XL (WELLBUTRIN XL) 300 MG 24 hr tablet, Take 300 mg by mouth daily., Disp: , Rfl:   .  busPIRone (BUSPAR) 30 MG tablet, Take 30 mg by mouth 2 (two) times daily., Disp: , Rfl:   .  Dulaglutide (TRULICITY SC), Inject  into the skin., Disp: , Rfl:   .  Linagliptin (TRADJENTA PO), Take by mouth., Disp: , Rfl:   .  canagliflozin (INVOKANA) 100 MG Tab tablet, Take 100 mg by mouth daily., Disp: , Rfl:   .  escitalopram (LEXAPRO) 5 MG tablet, Take 5 mg by mouth daily., Disp: , Rfl:   .  LORazepam (ATIVAN) 1 MG tablet, Take 1 mg by mouth every 6 (six) hours as needed for Anxiety., Disp: , Rfl:   .  ondansetron (ZOFRAN-ODT) 4 MG disintegrating tablet, Take 1 tablet (4 mg total) by mouth every 6 (six) hours as needed for Nausea, Disp: 8 tablet, Rfl: 0  .  traMADol (ULTRAM) 50 MG tablet, Take 2 tablets (100 mg total) by mouth every 6 (six) hours as needed for Pain., Disp: 40 tablet, Rfl: 0  .  traZODone (DESYREL) 50 MG tablet, , Disp: , Rfl:   .  Vortioxetine HBr (TRINTELLIX) 20 MG Tab, Take by mouth., Disp: ,  Rfl:     Allergies   Allergen Reactions   . Codeine        Medications and Allergies reviewed.    PHYSICAL EXAM     Vitals:    11/27/18 0859   BP: 135/81   Pulse: 86   Resp: 14   Temp: 98.4 F (36.9 C)   TempSrc: Tympanic   SpO2: 99%   Weight: 98.9 kg (218 lb)   Height: 1.727 m (5\' 8" )       Physical Exam   Constitutional: She is oriented to person, place, and time. She appears well-developed and well-nourished. No distress.   HENT:   Head: Normocephalic and atraumatic.   Eyes: Conjunctivae are normal.   Neck: Neck supple.   Pulmonary/Chest: Effort normal. No respiratory distress.   Musculoskeletal:         General: No deformity or edema.      Right knee: She exhibits normal range of motion, no swelling, no effusion, no ecchymosis, no deformity, no erythema, no LCL laxity, normal patellar mobility, no bony tenderness, normal meniscus (but pop felt with McMurray's maneuvers) and no MCL laxity. Tenderness found. Lateral joint line and patellar tendon (tender) tenderness noted. No MCL and no LCL tenderness noted.      Comments: Neg Lachman's, ant/post drawer  Pain with varus/valgus stress at full extension   Neurological: She is alert  and oriented to person, place, and time.   Skin: Skin is warm and dry. She is not diaphoretic.   Psychiatric: She has a normal mood and affect.         UCC COURSE       Results     ** No results found for the last 24 hours. **            Xr Knee Right 4+ Views    Result Date: 11/27/2018  CLINICAL INDICATION:  Anterior right knee pain and stiffness COMPARISON:  None available INTERPRETATION:    Four views of the right knee demonstrate no acute fracture or dislocation. Joint spaces are maintained. There is no erosion or abnormal soft tissue calcification.There is no joint effusion.       Unremarkable right knee. Wyatt Portela, MD  11/27/2018 9:21 AM        No orders of the defined types were placed in this encounter.    I reviewed xrays--agree with above      PROCEDURES     Procedures       ASSESSMENT     Encounter Diagnosis   Name Primary?   . Patellofemoral pain syndrome of right knee Yes          SSESSMENT    PLAN      Knee sleeve for comfort   Exercises from sports med advisor given to patient   Run on flat even surfaces   Ensure running shoes are appropriate for her feet and running style   F/u with PCP for physical therapy if no improvement/relief    Discussed results and diagnosis with patient/family.  Reviewed warning signs for worsening condition, as well as, indications for follow-up with pmd and return to urgent care clinic.   Patient/family expressed understanding of instructions.    Orders Placed This Encounter   Procedures   . Task for Knee Sleeve   . XR Knee Right 4+ Views         An After Visit Summary was printed and given to the patient.      Signed,  Kaelyn Innocent  Santiago Bumpers, MD

## 2020-02-26 ENCOUNTER — Emergency Department: Payer: Commercial Managed Care - POS

## 2020-02-26 ENCOUNTER — Emergency Department
Admission: EM | Admit: 2020-02-26 | Discharge: 2020-02-26 | Disposition: A | Discharge status: Home / Self Care | Payer: Commercial Managed Care - POS | Attending: Emergency Medicine | Admitting: Emergency Medicine

## 2020-02-26 DIAGNOSIS — Z9049 Acquired absence of other specified parts of digestive tract: Secondary | ICD-10-CM | POA: Insufficient documentation

## 2020-02-26 DIAGNOSIS — E860 Dehydration: Secondary | ICD-10-CM | POA: Insufficient documentation

## 2020-02-26 DIAGNOSIS — R17 Unspecified jaundice: Secondary | ICD-10-CM | POA: Insufficient documentation

## 2020-02-26 DIAGNOSIS — R935 Abnormal findings on diagnostic imaging of other abdominal regions, including retroperitoneum: Secondary | ICD-10-CM | POA: Insufficient documentation

## 2020-02-26 DIAGNOSIS — Z20822 Contact with and (suspected) exposure to covid-19: Secondary | ICD-10-CM | POA: Insufficient documentation

## 2020-02-26 DIAGNOSIS — Z7984 Long term (current) use of oral hypoglycemic drugs: Secondary | ICD-10-CM | POA: Insufficient documentation

## 2020-02-26 DIAGNOSIS — R112 Nausea with vomiting, unspecified: Secondary | ICD-10-CM | POA: Insufficient documentation

## 2020-02-26 DIAGNOSIS — R197 Diarrhea, unspecified: Secondary | ICD-10-CM | POA: Insufficient documentation

## 2020-02-26 DIAGNOSIS — R0602 Shortness of breath: Secondary | ICD-10-CM

## 2020-02-26 DIAGNOSIS — E119 Type 2 diabetes mellitus without complications: Secondary | ICD-10-CM | POA: Insufficient documentation

## 2020-02-26 LAB — COMPREHENSIVE METABOLIC PANEL
ALT: 11 U/L (ref 0–55)
AST (SGOT): 21 U/L (ref 5–34)
Albumin/Globulin Ratio: 1.4 (ref 0.9–2.2)
Albumin: 4.9 g/dL (ref 3.5–5.0)
Alkaline Phosphatase: 51 U/L (ref 37–106)
Anion Gap: 16 — ABNORMAL HIGH (ref 5.0–15.0)
BUN: 12 mg/dL (ref 7.0–19.0)
Bilirubin, Total: 2.2 mg/dL — ABNORMAL HIGH (ref 0.2–1.2)
CO2: 18 mEq/L — ABNORMAL LOW (ref 22–29)
Calcium: 10.2 mg/dL (ref 8.5–10.5)
Chloride: 105 mEq/L (ref 100–111)
Creatinine: 1 mg/dL (ref 0.6–1.0)
Globulin: 3.6 g/dL (ref 2.0–3.6)
Glucose: 127 mg/dL — ABNORMAL HIGH (ref 70–100)
Potassium: 4.2 mEq/L (ref 3.5–5.1)
Protein, Total: 8.5 g/dL — ABNORMAL HIGH (ref 6.0–8.3)
Sodium: 139 mEq/L (ref 136–145)

## 2020-02-26 LAB — CBC AND DIFFERENTIAL
Absolute NRBC: 0 10*3/uL (ref 0.00–0.00)
Basophils Absolute Automated: 0.05 10*3/uL (ref 0.00–0.08)
Basophils Automated: 0.3 %
Eosinophils Absolute Automated: 0 10*3/uL (ref 0.00–0.44)
Eosinophils Automated: 0 %
Hematocrit: 43.6 % (ref 34.7–43.7)
Hgb: 15.1 g/dL — ABNORMAL HIGH (ref 11.4–14.8)
Immature Granulocytes Absolute: 0.07 10*3/uL (ref 0.00–0.07)
Immature Granulocytes: 0.4 %
Lymphocytes Absolute Automated: 1.25 10*3/uL (ref 0.42–3.22)
Lymphocytes Automated: 7.4 %
MCH: 30.5 pg (ref 25.1–33.5)
MCHC: 34.6 g/dL (ref 31.5–35.8)
MCV: 88.1 fL (ref 78.0–96.0)
MPV: 8.6 fL — ABNORMAL LOW (ref 8.9–12.5)
Monocytes Absolute Automated: 0.47 10*3/uL (ref 0.21–0.85)
Monocytes: 2.8 %
Neutrophils Absolute: 14.97 10*3/uL — ABNORMAL HIGH (ref 1.10–6.33)
Neutrophils: 89.1 %
Nucleated RBC: 0 /100 WBC (ref 0.0–0.0)
Platelets: 406 10*3/uL — ABNORMAL HIGH (ref 142–346)
RBC: 4.95 10*6/uL (ref 3.90–5.10)
RDW: 12 % (ref 11–15)
WBC: 16.81 10*3/uL — ABNORMAL HIGH (ref 3.10–9.50)

## 2020-02-26 LAB — TROPONIN I: Troponin I: <0.01 ng/mL (ref 0.00–0.05)

## 2020-02-26 LAB — COVID-19 (SARS-COV-2): SARS CoV 2 Overall Result: NEGATIVE

## 2020-02-26 LAB — HCG, SERUM, QUALITATIVE: Hcg Qualitative: NEGATIVE

## 2020-02-26 LAB — GFR: EGFR: >60

## 2020-02-26 MED ORDER — ONDANSETRON HCL 4 MG/2ML IJ SOLN
4.00 mg | Freq: Once | INTRAMUSCULAR | Status: AC
Start: 2020-02-26 — End: 2020-02-26
  Administered 2020-02-26: 4 mg via INTRAVENOUS

## 2020-02-26 MED ORDER — IOHEXOL 350 MG/ML IV SOLN
100.00 mL | Freq: Once | INTRAVENOUS | Status: AC | PRN
Start: 2020-02-26 — End: 2020-02-26
  Administered 2020-02-26: 100 mL via INTRAVENOUS

## 2020-02-26 MED ORDER — FAMOTIDINE 10 MG/ML IV SOLN (WRAP)
20.00 mg | Freq: Once | INTRAVENOUS | Status: AC
Start: 2020-02-26 — End: 2020-02-26
  Administered 2020-02-26: 20 mg via INTRAVENOUS

## 2020-02-26 MED ORDER — FAMOTIDINE 20 MG PO TABS
20.0000 mg | ORAL_TABLET | Freq: Two times a day (BID) | ORAL | 0 refills | Status: AC
Start: 2020-02-26 — End: 2020-03-11

## 2020-02-26 MED ORDER — SODIUM CHLORIDE 0.9 % IV BOLUS
1000.00 mL | Freq: Once | INTRAVENOUS | Status: AC
Start: 2020-02-26 — End: 2020-02-26
  Administered 2020-02-26: 1000 mL via INTRAVENOUS

## 2020-02-26 MED ORDER — DIPHENOXYLATE-ATROPINE 2.5-0.025 MG PO TABS
2.0000 | ORAL_TABLET | Freq: Four times a day (QID) | ORAL | 0 refills | Status: AC | PRN
Start: 2020-02-26 — End: ?

## 2020-02-26 MED ORDER — ONDANSETRON 4 MG PO TBDP
4.0000 mg | ORAL_TABLET | Freq: Four times a day (QID) | ORAL | 0 refills | Status: AC | PRN
Start: 2020-02-26 — End: ?

## 2020-02-26 MED ORDER — MORPHINE SULFATE 4 MG/ML IJ/IV SOLN (WRAP)
4.0000 mg | Freq: Once | Status: AC
Start: 2020-02-26 — End: 2020-02-26
  Administered 2020-02-26: 4 mg via INTRAVENOUS

## 2020-02-26 NOTE — Discharge Instructions (Signed)
1. Return immediately if worse in any way.    2. Follow up with your primary medical doctor for recheck and further evaluation of your elevated bilirubin and abnormal CT results.  Return to the emergency deparment if unable to follow up as instructed for any reason.    3. If you have any issues with follow up please call the [facility] Case Manager at [phone] between 9am and 4pm Monday-Friday for assistance.       Abnormal Radiologic Findings    During your visit you had a CAT scan.    There are some abnormalities on the CAT scan. These do not need immediate attention today, but you will need to follow up closely with your doctor,     You may need another CAT scan or more tests.     Your doctor can give you more details and schedule more tests if needed.

## 2020-02-26 NOTE — ED Notes (Signed)
Assisting primary RN with D/C. D/C instructions reviewed with pt. Pt. verbalized understanding. VSS. Pt. signed the hard-copy D/C instructions. Pt. D/C home.

## 2020-02-27 LAB — ECG 12-LEAD
Atrial Rate: 81 {beats}/min
P Axis: 56 degrees
P-R Interval: 164 ms
Q-T Interval: 406 ms
QRS Duration: 106 ms
QTC Calculation (Bezet): 471 ms
R Axis: 19 degrees
T Axis: 16 degrees
Ventricular Rate: 81 {beats}/min

## 2020-02-29 NOTE — ED Provider Notes (Signed)
EMERGENCY DEPARTMENT NOTE     Patient initially seen and examined at   ED PHYSICIAN ASSIGNED     Date/Time Event User Comments    02/26/20 1919 Physician Assigned Livingston, Shela Nevin, Veverly Fells, MD assigned as Attending         ED MIDLEVEL (APP) ASSIGNED     None          HISTORY OF PRESENT ILLNESS   Historian:Patient  Translator Used: no    Chief Complaint: Nausea, Emesis, Dizziness, and Shortness of Breath     39 y.o. female presents emerged department with 1 day of nonbloody nonbilious nausea vomiting and nonbloody diarrhea associated with crampy abdominal pain.  No fever or chills.  No dysuria or hematuria.  No vaginal bleeding or discharge.  Patient does note some mild shortness of breath with vomiting only.  No chest pain.    1. Location of symptoms: Abdomen  2. Onset of symptoms: 1 day prior arrival  3. What was patient doing when symptoms started (Context): see above  4. Severity: moderate  5. Timing: Constant  6. Activities that worsen symptoms: None  7. Activities that improve symptoms: None  8. Quality: Cramping  9. Radiation of symptoms: no  10. Associated signs and Symptoms: see above  11. Are symptoms worsening? yes  MEDICAL HISTORY     Past Medical History:  Past Medical History:   Diagnosis Date   . Anxiety    . Depression    . Diabetes mellitus        Past Surgical History:  Past Surgical History:   Procedure Laterality Date   . CESAREAN SECTION     . CHOLECYSTECTOMY         Social History:  Social History     Socioeconomic History   . Marital status: Married     Spouse name: Not on file   . Number of children: Not on file   . Years of education: Not on file   . Highest education level: Not on file   Occupational History   . Not on file   Tobacco Use   . Smoking status: Former Games developer   . Smokeless tobacco: Current User   Vaping Use   . Vaping Use: Every day   Substance and Sexual Activity   . Alcohol use: Yes     Alcohol/week: 2.0 standard drinks     Types: 2 Cans of beer per week   . Drug  use: Yes     Types: Marijuana   . Sexual activity: Not on file   Other Topics Concern   . Not on file   Social History Narrative   . Not on file     Social Determinants of Health     Financial Resource Strain:    . Difficulty of Paying Living Expenses:    Food Insecurity:    . Worried About Programme researcher, broadcasting/film/video in the Last Year:    . Barista in the Last Year:    Transportation Needs:    . Freight forwarder (Medical):    Marland Kitchen Lack of Transportation (Non-Medical):    Physical Activity:    . Days of Exercise per Week:    . Minutes of Exercise per Session:    Stress:    . Feeling of Stress :    Social Connections:    . Frequency of Communication with Friends and Family:    . Frequency of Social Gatherings with Friends and  Family:    . Attends Religious Services:    . Active Member of Clubs or Organizations:    . Attends Banker Meetings:    Marland Kitchen Marital Status:    Intimate Partner Violence:    . Fear of Current or Ex-Partner:    . Emotionally Abused:    Marland Kitchen Physically Abused:    . Sexually Abused:        Family History:  History reviewed. No pertinent family history.    Outpatient Medication:  Discharge Medication List as of 02/26/2020  9:39 PM      CONTINUE these medications which have NOT CHANGED    Details   buPROPion XL (WELLBUTRIN XL) 300 MG 24 hr tablet Take 300 mg by mouth daily., Historical Med      busPIRone (BUSPAR) 30 MG tablet Take 30 mg by mouth 2 (two) times daily., Until Discontinued, Historical Med      canagliflozin (INVOKANA) 100 MG Tab tablet Take 100 mg by mouth daily., Until Discontinued, Historical Med      Dulaglutide (TRULICITY SC) Inject into the skin., Historical Med      escitalopram (LEXAPRO) 5 MG tablet Take 5 mg by mouth daily., Historical Med      Linagliptin (TRADJENTA PO) Take by mouth., Until Discontinued, Historical Med      LORazepam (ATIVAN) 1 MG tablet Take 1 mg by mouth every 6 (six) hours as needed for Anxiety., Until Discontinued, Historical Med      traMADol  (ULTRAM) 50 MG tablet Take 2 tablets (100 mg total) by mouth every 6 (six) hours as needed for Pain., Starting Thu 06/11/2016, Print      traZODone (DESYREL) 50 MG tablet Starting Sun 11/20/2018, Historical Med      Vortioxetine HBr (TRINTELLIX) 20 MG Tab Take by mouth., Until Discontinued, Historical Med               REVIEW OF SYSTEMS   Review of Systems   Constitutional: Negative for chills and fever.   Respiratory: Negative for cough.    Cardiovascular: Negative for chest pain and leg swelling.   Gastrointestinal: Positive for abdominal pain, diarrhea, nausea and vomiting.   Genitourinary: Negative for dysuria.   All other systems reviewed and are negative.      PHYSICAL EXAM     ED Triage Vitals   Enc Vitals Group      BP 02/26/20 1920 131/72      Heart Rate 02/26/20 1920 77      Resp Rate 02/26/20 1920 (!) 24      Temp 02/26/20 1920 97.8 F (36.6 C)      Temp Source 02/26/20 1920 Oral      SpO2 02/26/20 1920 100 %      Weight 02/26/20 1920 99.8 kg      Height 02/26/20 1920 1.727 m      Head Circumference --       Peak Flow --       Pain Score 02/26/20 2140 0      Pain Loc --       Pain Edu? --       Excl. in GC? --    Nursing note and vitals reviewed.  Constitutional:  Well developed, well nourished.  Awake & alert.    Head:  Atraumatic.  Normocephalic.    ENT:  Mucous membranes are moist and intact.  Patent airway.  Neck:  Supple.  No JVD.   Cardiovascular:  Regular rate.  Regular rhythm.  Pulmonary/Chest:  No evidence of respiratory distress.  Clear to auscultation bilaterally.  No wheezing, rales or rhonchi.   Abdominal:  Soft and non-distended.  There is no tenderness.  No rebound, guarding, or rigidity.  No bruit. No masses palpable  Back:  No CVA tenderness.   Extremities:  No edema.   No cyanosis.  No clubbing.  2+ DP/PT/radial pulses b/l.  Skin:  Skin is warm and dry.  No diaphoresis.    Neurological:  Alert, awake, and appropriate.  Normal speech.  Moves all extremities.  Normal gate.  Psychiatric:   Good eye contact.  Normal interaction, affect, and behavior    MEDICAL DECISION MAKING     DISCUSSION    CT and pelvis rule out SBO negative for Q process does show intrahepatic biliary ductal dilatation and hyperbilirubinemia is noted.  I doubt this is related to the patient's current symptoms but does need follow-up and patient informed of this.  Leukocytosis noted likely due to dehydration and stress response doubt occult bacterial infection.  Doubt referred intrathoracic process including ACS PE or aortic abnormality.  Patient given precautions and conservative care instructions.  Will discharge home follow-up primary medical doctor return if worse.  Patient understands and agrees with plan.       Vital Signs: Reviewed the patient's vital signs.   Nursing Notes: Reviewed and utilized available nursing notes.  Medical Records Reviewed: Reviewed available past medical records.  Counseling: The emergency provider has spoken with the patient and discussed today's findings, in addition to providing specific details for the plan of care.  Questions are answered and there is agreement with the plan.      CARDIAC STUDIES    The following cardiac studies were independently interpreted by the Emergency Medicine Physician.  For full cardiac study results please see chart.    Monitor Strip  Interpreted by ED Physician  Rate: 75  Rhythm: NSR   ST Changes: none    EKG Interpretation:  Signed and interpreted by ED Provider   Time Interpreted: 1954  Rate: 81  Rhythm: NSR  Axis: normal  Intervals: Borderline QTC  Blocks: none  ST segments: no acute changes  Interpretation: Nonspecific  EKG    EMERGENCY IMAGING STUDIES    The following imagine studies were independently interpreted by me (emergency physician):    Radiology:  Interpreted by me (ED Physician)  Study: Chest Xray   Results: No infiltrate. No pneumothorax. No hemothorax. No cardiomegaly. No CHF.  Impression: No acute intrathoracic abnormality.  RADIOLOGY IMAGING  STUDIES      XR Chest 2 Views   Final Result    No acute pulmonary or pleural disease.      Sandie Ano, MD    02/26/2020 8:50 PM      CT Abd/ Pelvis with IV Contrast   Final Result          1. Mildly thickened appearance of portions of the colonic wall as   described above is nonspecific and may be artifactual related to   underdistention. However, mild colitis cannot be excluded.      2. Intrahepatic biliary ductal dilatation.      3. Minimal free pelvic fluid.      4. Additional incidental findings as described above.      Sandie Ano, MD    02/26/2020 8:59 PM            PULSE OXIMETRY    Oxygen Saturation by Pulse Oximetry: 100%  Interventions: none  Interpretation:  normal    EMERGENCY DEPT. MEDICATIONS      ED Medication Orders (From admission, onward)    Start Ordered     Status Ordering Provider    02/26/20 2045 02/26/20 2045  iohexol (OMNIPAQUE) 350 MG/ML injection 100 mL  IMG once as needed     Route: Intravenous  Ordered Dose: 100 mL     Last MAR action: Imaging Agent Given Bethann Berkshire D    02/26/20 1932 02/26/20 1931  sodium chloride 0.9 % bolus 1,000 mL  Once     Route: Intravenous  Ordered Dose: 1,000 mL     Last MAR action: Stopped Bethann Berkshire D    02/26/20 1932 02/26/20 1931  morphine injection 4 mg  Once     Route: Intravenous  Ordered Dose: 4 mg     Last MAR action: Given Rielly Corlett D    02/26/20 1932 02/26/20 1931  ondansetron (ZOFRAN) injection 4 mg  Once     Route: Intravenous  Ordered Dose: 4 mg     Last MAR action: Given Deundra Bard D    02/26/20 1932 02/26/20 1931  famotidine (PEPCID) injection 20 mg  Once     Route: Intravenous  Ordered Dose: 20 mg     Last MAR action: Given Kimiyo Carmicheal D          LABORATORY RESULTS    Ordered and independently interpreted AVAILABLE laboratory tests. Please see results section in chart for full details.  Results for orders placed or performed during the hospital encounter of 02/26/20   COVID-19 (SARS-COV-2) (Rapid test) - High risk  patient    Specimen: Nasopharynx; Nasopharyngeal Swab   Result Value Ref Range    Purpose of COVID testing Diagnostic -PUI     SARS-CoV-2 Specimen Source Nasopharyngeal     SARS CoV 2 Overall Result Negative    CBC and differential   Result Value Ref Range    WBC 16.81 (H) 3.1 - 9.5 x10 3/uL    Hgb 15.1 (H) 11.4 - 14.8 g/dL    Hematocrit 09.8 11.9 - 43.7 %    Platelets 406 (H) 142 - 346 x10 3/uL    RBC 4.95 3.90 - 5.10 x10 6/uL    MCV 88.1 78.0 - 96.0 fL    MCH 30.5 25.1 - 33.5 pg    MCHC 34.6 31 - 35 g/dL    RDW 12 14.7 - 82.9 %    MPV 8.6 (L) 8.9 - 12.5 fL    Neutrophils 89.1 None %    Lymphocytes Automated 7.4 None %    Monocytes 2.8 None %    Eosinophils Automated 0.0 None %    Basophils Automated 0.3 None %    Immature Granulocytes 0.4 None %    Nucleated RBC 0.0 0.0 - 0.0 /100 WBC    Neutrophils Absolute 14.97 (H) 1 - 6 x10 3/uL    Lymphocytes Absolute Automated 1.25 0.42 - 3.22 x10 3/uL    Monocytes Absolute Automated 0.47 0.21 - 0.85 x10 3/uL    Eosinophils Absolute Automated 0.00 0.00 - 0.44 x10 3/uL    Basophils Absolute Automated 0.05 0.00 - 0.08 x10 3/uL    Immature Granulocytes Absolute 0.07 0.00 - 0.07 x10 3/uL    Absolute NRBC 0.00 0.00 - 0.00 x10 3/uL   Comprehensive metabolic panel   Result Value Ref Range    Glucose 127 (H) 70 - 100 mg/dL    BUN 56.2 7 - 19 mg/dL  Creatinine 1.0 0.6 - 1.0 mg/dL    Sodium 161 096 - 045 mEq/L    Potassium 4.2 3.5 - 5.1 mEq/L    Chloride 105 100 - 111 mEq/L    CO2 18 (L) 22 - 29 mEq/L    Calcium 10.2 8.5 - 10.5 mg/dL    Protein, Total 8.5 (H) 6.0 - 8.3 g/dL    Albumin 4.9 3.5 - 5.0 g/dL    AST (SGOT) 21 5 - 34 U/L    ALT 11 0 - 55 U/L    Alkaline Phosphatase 51 37 - 106 U/L    Bilirubin, Total 2.2 (H) 0.2 - 1.2 mg/dL    Globulin 3.6 2.0 - 3.6 g/dL    Albumin/Globulin Ratio 1.4 0.9 - 2.2    Anion Gap 16.0 (H) 5 - 15   Troponin I   Result Value Ref Range    Troponin I <0.01 0.00 - 0.05 ng/mL   GFR   Result Value Ref Range    EGFR >60.0    Beta HCG, Qual, Serum    Result Value Ref Range    Hcg Qualitative Negative Negative   ECG 12 lead   Result Value Ref Range    Ventricular Rate 81 BPM    Atrial Rate 81 BPM    P-R Interval 164 ms    QRS Duration 106 ms    Q-T Interval 406 ms    QTC Calculation (Bezet) 471 ms    P Axis 56 degrees    R Axis 19 degrees    T Axis 16 degrees       CRITICAL CARE/PROCEDURES    Procedures    DIAGNOSIS      Diagnosis:  Final diagnoses:   Nausea vomiting and diarrhea   Dehydration   Hyperbilirubinemia   Abnormal CT of the abdomen       Disposition:  ED Disposition     ED Disposition Condition Date/Time Comment    Discharge  Mon Feb 26, 2020  9:35 PM Kara Boyd discharge to home/self care.    Condition at disposition: Stable          Prescriptions:  Discharge Medication List as of 02/26/2020  9:39 PM      START taking these medications    Details   diphenoxylate-atropine (LOMOTIL) 2.5-0.025 MG per tablet Take 2 tablets by mouth every 6 (six) hours as needed for Diarrhea, Starting Mon 02/26/2020, Print      famotidine (PEPCID) 20 MG tablet Take 1 tablet (20 mg total) by mouth 2 (two) times daily for 14 days, Starting Mon 02/26/2020, Until Mon 03/11/2020, Print         CONTINUE these medications which have CHANGED    Details   ondansetron (ZOFRAN-ODT) 4 MG disintegrating tablet Take 1 tablet (4 mg total) by mouth every 6 (six) hours as needed for Nausea, Starting Mon 02/26/2020, Print         CONTINUE these medications which have NOT CHANGED    Details   buPROPion XL (WELLBUTRIN XL) 300 MG 24 hr tablet Take 300 mg by mouth daily., Historical Med      busPIRone (BUSPAR) 30 MG tablet Take 30 mg by mouth 2 (two) times daily., Until Discontinued, Historical Med      canagliflozin (INVOKANA) 100 MG Tab tablet Take 100 mg by mouth daily., Until Discontinued, Historical Med      Dulaglutide (TRULICITY SC) Inject into the skin., Historical Med      escitalopram (LEXAPRO) 5 MG  tablet Take 5 mg by mouth daily., Historical Med      Linagliptin (TRADJENTA PO)  Take by mouth., Until Discontinued, Historical Med      LORazepam (ATIVAN) 1 MG tablet Take 1 mg by mouth every 6 (six) hours as needed for Anxiety., Until Discontinued, Historical Med      traMADol (ULTRAM) 50 MG tablet Take 2 tablets (100 mg total) by mouth every 6 (six) hours as needed for Pain., Starting Thu 06/11/2016, Print      traZODone (DESYREL) 50 MG tablet Starting Sun 11/20/2018, Historical Med      Vortioxetine HBr (TRINTELLIX) 20 MG Tab Take by mouth., Until Discontinued, Historical Med             This note was generated by the Epic EMR system/ Dragon speech recognition and may contain inherent errors or omissions not intended by the user. Grammatical errors, random word insertions, deletions and pronoun errors  are occasional consequences of this technology due to software limitations. Not all errors are caught or corrected. If there are questions or concerns about the content of this note or information contained within the body of this dictation they should be addressed directly with the author for clarification.     Shela Nevin, MD  02/29/20 1120

## 2020-08-30 ENCOUNTER — Encounter (INDEPENDENT_AMBULATORY_CARE_PROVIDER_SITE_OTHER): Payer: Self-pay

## 2020-08-30 ENCOUNTER — Ambulatory Visit (INDEPENDENT_AMBULATORY_CARE_PROVIDER_SITE_OTHER): Payer: Commercial Managed Care - POS

## 2020-08-30 ENCOUNTER — Ambulatory Visit (INDEPENDENT_AMBULATORY_CARE_PROVIDER_SITE_OTHER): Payer: Commercial Managed Care - POS | Admitting: Family

## 2020-08-30 VITALS — BP 118/75 | HR 73 | Temp 97.6°F | Resp 16 | Ht 68.0 in | Wt 214.0 lb

## 2020-08-30 DIAGNOSIS — S80212A Abrasion, left knee, initial encounter: Secondary | ICD-10-CM

## 2020-08-30 DIAGNOSIS — B379 Candidiasis, unspecified: Secondary | ICD-10-CM

## 2020-08-30 DIAGNOSIS — E119 Type 2 diabetes mellitus without complications: Secondary | ICD-10-CM

## 2020-08-30 DIAGNOSIS — T3695XA Adverse effect of unspecified systemic antibiotic, initial encounter: Secondary | ICD-10-CM

## 2020-08-30 DIAGNOSIS — L03116 Cellulitis of left lower limb: Secondary | ICD-10-CM

## 2020-08-30 DIAGNOSIS — Z23 Encounter for immunization: Secondary | ICD-10-CM

## 2020-08-30 MED ORDER — CEPHALEXIN 500 MG PO CAPS
500.0000 mg | ORAL_CAPSULE | Freq: Three times a day (TID) | ORAL | 0 refills | Status: AC
Start: 2020-08-30 — End: 2020-09-09

## 2020-08-30 MED ORDER — FLUCONAZOLE 150 MG PO TABS
150.0000 mg | ORAL_TABLET | Freq: Once | ORAL | 0 refills | Status: AC
Start: 2020-08-30 — End: 2020-08-30

## 2020-08-30 NOTE — Progress Notes (Signed)
[facility]  PROGRESS NOTE     Patient: Kara Boyd   Date: 08/30/2020   MRN: [id number]       Kara Boyd is a 40 y.o. female      HISTORY     History obtained from: Patient    Chief Complaint   Patient presents with   . Laceration     Got a cut below the left knee 3 days ago when fell on the concrete sidewalk. Still very tender around the injury.Tdap unknown.        HPI 40 yo female s/p fall 3 days ago where she was walking and fell down on her left knee/lower leg.  Pt with swelling and tenderness below her knee with an abrasion.  Denies any systemic symptoms. Last TDAP unknown.     Review of Systems   Constitutional: Positive for activity change.   Respiratory: Negative.    Cardiovascular: Negative.    Musculoskeletal: Positive for arthralgias (right lower leg ).   Skin: Wound: right knee abrasion.       History:    Pertinent Past Medical, Surgical, Family and Social History were reviewed.        Current Outpatient Medications:   .  buPROPion XL (WELLBUTRIN XL) 300 MG 24 hr tablet, Take 300 mg by mouth daily., Disp: , Rfl:   .  citalopram (CeleXA) 20 MG tablet, Take 20 mg by mouth daily, Disp: , Rfl:   .  Dulaglutide (TRULICITY SC), Inject into the skin., Disp: , Rfl:   .  famotidine (PEPCID) 40 MG tablet, Take 40 mg by mouth daily, Disp: , Rfl:   .  Linagliptin (TRADJENTA PO), Take by mouth., Disp: , Rfl:   .  LORazepam (ATIVAN) 1 MG tablet, Take 1 mg by mouth every 6 (six) hours as needed for Anxiety., Disp: , Rfl:   .  Synjardy XR 12.09-998 MG Tablet SR 24 hr, , Disp: , Rfl:   .  Vyvanse 30 MG capsule, , Disp: , Rfl:   .  busPIRone (BUSPAR) 30 MG tablet, Take 30 mg by mouth 2 (two) times daily., Disp: , Rfl:   .  canagliflozin (INVOKANA) 100 MG Tab tablet, Take 100 mg by mouth daily., Disp: , Rfl:   .  cephalexin (KEFLEX) 500 MG capsule, Take 1 capsule (500 mg total) by mouth 3 (three) times daily for 10 days, Disp: 30 capsule, Rfl: 0  .  diphenoxylate-atropine (LOMOTIL) 2.5-0.025 MG per  tablet, Take 2 tablets by mouth every 6 (six) hours as needed for Diarrhea, Disp: 20 tablet, Rfl: 0  .  escitalopram (LEXAPRO) 5 MG tablet, Take 5 mg by mouth daily., Disp: , Rfl:   .  fluconazole (DIFLUCAN) 150 MG tablet, Take 1 tablet (150 mg total) by mouth once for 1 dose, Disp: 1 tablet, Rfl: 0  .  ondansetron (ZOFRAN-ODT) 4 MG disintegrating tablet, Take 1 tablet (4 mg total) by mouth every 6 (six) hours as needed for Nausea, Disp: 12 tablet, Rfl: 0  .  traMADol (ULTRAM) 50 MG tablet, Take 2 tablets (100 mg total) by mouth every 6 (six) hours as needed for Pain., Disp: 40 tablet, Rfl: 0  .  traZODone (DESYREL) 50 MG tablet, , Disp: , Rfl:   .  Vortioxetine HBr 20 MG Tab, Take by mouth., Disp: , Rfl:     Allergies   Allergen Reactions   . Codeine        Medications and Allergies reviewed.  PHYSICAL EXAM     Vitals:    08/30/20 1438   BP: 118/75   Pulse: 73   Resp: 16   Temp: 97.6 F (36.4 C)   TempSrc: Tympanic   SpO2: 97%   Weight: 97.1 kg (214 lb)   Height: 1.727 m (5\' 8" )       Physical Exam  Constitutional:       Appearance: Normal appearance.   Musculoskeletal:      Right knee: Normal.      Left knee: Normal.      Right lower leg: Normal.      Left lower leg: Swelling, tenderness and bony tenderness present.        Legs:    Neurological:      Mental Status: She is alert.   Vitals and nursing note reviewed.           UCC COURSE     There were no labs reviewed with this patient during the visit.      X-Ray  The following X-ray studies were ordered, visualized and independently interpreted by me. Results were discussed with the patient/family.     XR Tibia Fibula Left AP And Lateral    Result Date: 08/30/2020  XR TIBIA FIBULA LEFT AP AND LATERAL: 08/30/2020 2:57 PM CLINICAL INFORMATION: s/p fall on knee; pain in tib fib. COMPARISON: None. FINDINGS: No evidence of acute fracture or dislocation. Joint spaces are maintained. Small patellar and tibial tuberosity enthesophytes are noted. Soft tissue appears  unremarkable.     No acute osseous abnormality. Rozann Lesches, MD  08/30/2020 3:08 PM      No current facility-administered medications for this visit.       PROCEDURES     Procedures    MEDICAL DECISION MAKING     History, physical, labs/studies most consistent with cellulitis as the diagnosis.    Chart Review:  Prior PCP, Specialist and/or ED notes reviewed today: Not Applicable  Prior labs/images/studies reviewed today: Not Applicable    Differential Diagnosis: abrasion, open fracture, foreign body, delayed wound healing       ASSESSMENT     Encounter Diagnoses   Name Primary?   . Cellulitis of left lower limb Yes   . Abrasion of left knee, initial encounter    . Type 2 diabetes mellitus without complication, without long-term current use of insulin    . Antibiotic-induced yeast infection             PLAN      PLAN:     No fracture as seen on left tib fib x-ray  Concern for cellulitis secondary to the abrasion in view of warmth, tenderness and erythema. Will start Keflex TID x10 days.  F/U if symptoms do not improve, or worsen in 72 hours      Orders Placed This Encounter   Procedures   . XR Tibia Fibula Left AP And Lateral   . Tdap vaccine greater than or equal to 7yo IM     Requested Prescriptions     Signed Prescriptions Disp Refills   . cephalexin (KEFLEX) 500 MG capsule 30 capsule 0     Sig: Take 1 capsule (500 mg total) by mouth 3 (three) times daily for 10 days   . fluconazole (DIFLUCAN) 150 MG tablet 1 tablet 0     Sig: Take 1 tablet (150 mg total) by mouth once for 1 dose       Discussed results and diagnosis with patient/family.  Reviewed warning  signs for worsening condition, as well as, indications for follow-up with primary care physician and return to urgent care clinic.   Patient/family expressed understanding of instructions.     An After Visit Summary was provided to the patient.

## 2020-09-04 ENCOUNTER — Ambulatory Visit (INDEPENDENT_AMBULATORY_CARE_PROVIDER_SITE_OTHER): Payer: Commercial Managed Care - POS | Admitting: Nurse Practitioner

## 2020-09-04 ENCOUNTER — Encounter (INDEPENDENT_AMBULATORY_CARE_PROVIDER_SITE_OTHER): Payer: Self-pay | Admitting: Nurse Practitioner

## 2020-09-04 VITALS — BP 115/74 | HR 74 | Temp 98.1°F | Resp 18 | Wt 213.0 lb

## 2020-09-04 DIAGNOSIS — T148XXA Other injury of unspecified body region, initial encounter: Secondary | ICD-10-CM

## 2020-09-04 MED ORDER — FLUCONAZOLE 150 MG PO TABS
150.0000 mg | ORAL_TABLET | ORAL | 0 refills | Status: AC
Start: 2020-09-04 — End: 2020-09-08

## 2020-09-04 NOTE — Progress Notes (Signed)
[facility]  PROGRESS NOTE     Patient: Kara Boyd   Date: 09/04/2020   MRN: [id number]       Kara Boyd is a 40 y.o. female  Seen 08/30/20 for eval of knee pain abrasion eval.   Tdap updated   No fever leg calf pain .    HISTORY     History obtained from: Patient    Chief Complaint   Patient presents with   . Cellulitis     Left shin; wound looks white and pt is concerned that it's not healing right;         HPI     Review of Systems   Constitutional: Negative for activity change, chills, diaphoresis and fever.   HENT: Negative for congestion and rhinorrhea.    Respiratory: Negative for cough and shortness of breath.    Cardiovascular: Negative for chest pain and leg swelling.   Gastrointestinal: Negative for nausea and vomiting.   Musculoskeletal: Negative for arthralgias, back pain, gait problem, joint swelling and neck pain.   Skin: Positive for wound. Negative for color change.   Neurological: Negative for numbness and headaches.   Hematological: Does not bruise/bleed easily.   Psychiatric/Behavioral: Negative for confusion and sleep disturbance. The patient is not nervous/anxious.        History:    Pertinent Past Medical, Surgical, Family and Social History were reviewed.        Current Outpatient Medications:   .  buPROPion XL (WELLBUTRIN XL) 300 MG 24 hr tablet, Take 300 mg by mouth daily., Disp: , Rfl:   .  cephalexin (KEFLEX) 500 MG capsule, Take 1 capsule (500 mg total) by mouth 3 (three) times daily for 10 days, Disp: 30 capsule, Rfl: 0  .  citalopram (CeleXA) 20 MG tablet, Take 20 mg by mouth daily, Disp: , Rfl:   .  Dulaglutide (TRULICITY SC), Inject into the skin., Disp: , Rfl:   .  famotidine (PEPCID) 40 MG tablet, Take 40 mg by mouth daily, Disp: , Rfl:   .  LORazepam (ATIVAN) 1 MG tablet, Take 1 mg by mouth every 6 (six) hours as needed for Anxiety., Disp: , Rfl:   .  Synjardy XR 12.09-998 MG Tablet SR 24 hr, , Disp: , Rfl:   .  Vyvanse 30 MG capsule, , Disp: , Rfl:   .  busPIRone  (BUSPAR) 30 MG tablet, Take 30 mg by mouth 2 (two) times daily., Disp: , Rfl:   .  canagliflozin (INVOKANA) 100 MG Tab tablet, Take 100 mg by mouth daily., Disp: , Rfl:   .  diphenoxylate-atropine (LOMOTIL) 2.5-0.025 MG per tablet, Take 2 tablets by mouth every 6 (six) hours as needed for Diarrhea, Disp: 20 tablet, Rfl: 0  .  escitalopram (LEXAPRO) 5 MG tablet, Take 5 mg by mouth daily., Disp: , Rfl:   .  Linagliptin (TRADJENTA PO), Take by mouth., Disp: , Rfl:   .  ondansetron (ZOFRAN-ODT) 4 MG disintegrating tablet, Take 1 tablet (4 mg total) by mouth every 6 (six) hours as needed for Nausea, Disp: 12 tablet, Rfl: 0  .  traMADol (ULTRAM) 50 MG tablet, Take 2 tablets (100 mg total) by mouth every 6 (six) hours as needed for Pain., Disp: 40 tablet, Rfl: 0  .  traZODone (DESYREL) 50 MG tablet, , Disp: , Rfl:   .  Vortioxetine HBr 20 MG Tab, Take by mouth., Disp: , Rfl:     Allergies   Allergen  Reactions   . Codeine        Medications and Allergies reviewed.    PHYSICAL EXAM     Vitals:    09/04/20 1841   BP: 115/74   Pulse: 74   Resp: 18   Temp: 98.1 F (36.7 C)   TempSrc: Temporal   SpO2: 99%   Weight: 96.6 kg (213 lb)       Physical Exam  Constitutional:       General: She is not in acute distress.     Appearance: She is well-developed.   HENT:      Head: Normocephalic and atraumatic.     Eyes: Conjunctivae are normal. Cardiovascular:      Rate and Rhythm: Normal rate and regular rhythm.      Heart sounds: Normal heart sounds, S1 normal and S2 normal.   Pulmonary:      Effort: Pulmonary effort is normal.      Breath sounds: Normal breath sounds. No decreased breath sounds, wheezing, rhonchi or rales.   Musculoskeletal:      Right knee: Normal.      Left knee: Normal range of motion.        Legs:    Neurological:      General: No focal deficit present.      Mental Status: She is alert and oriented to person, place, and time.   Skin:     General: Skin is warm.      Capillary Refill: Capillary refill takes less than  2 seconds.   Vitals and nursing note reviewed.         UCC COURSE     There were no labs reviewed with this patient during the visit.      There were no x-rays reviewed with this patient during the visit.    No current facility-administered medications for this visit.       PROCEDURES     Procedures    MEDICAL DECISION MAKING     History, physical, labs/studies most consistent with abrasion  as the diagnosis.    Chart Review:  Prior PCP, Specialist and/or ED notes reviewed today: Yes  Prior labs/images/studies reviewed today: Yes    Differential Diagnosis: fracture, cellulitis       ASSESSMENT     No diagnosis found.         PLAN      PLAN: d/w patient wound care that wound appears to be healing finish keflex as the redness is minimal and center of wound with nml healing stage       No orders of the defined types were placed in this encounter.    Requested Prescriptions      No prescriptions requested or ordered in this encounter       Discussed results and diagnosis with patient/family.  Reviewed warning signs for worsening condition, as well as, indications for follow-up with primary care physician and return to urgent care clinic.   Patient/family expressed understanding of instructions.     An After Visit Summary was provided to the patient.

## 2021-12-07 ENCOUNTER — Emergency Department: Payer: PRIVATE HEALTH INSURANCE

## 2021-12-07 ENCOUNTER — Emergency Department
Admission: EM | Admit: 2021-12-07 | Discharge: 2021-12-07 | Disposition: A | Discharge status: Home / Self Care | Payer: PRIVATE HEALTH INSURANCE | Attending: Emergency Medicine | Admitting: Emergency Medicine

## 2021-12-07 DIAGNOSIS — K529 Noninfective gastroenteritis and colitis, unspecified: Secondary | ICD-10-CM

## 2021-12-07 LAB — URINALYSIS REFLEX TO MICROSCOPIC EXAM - REFLEX TO CULTURE
Bilirubin, UA: NEGATIVE
Glucose, UA: NEGATIVE
Ketones UA: >=160 — AB
Leukocyte Esterase, UA: NEGATIVE
Nitrite, UA: NEGATIVE
Protein, UR: 30 — AB
Specific Gravity UA: 1.02 (ref 1.001–1.035)
Urine pH: 7.5 (ref 5.0–8.0)
Urobilinogen, UA: 0.2 mg/dL (ref 0.2–2.0)

## 2021-12-07 LAB — CBC AND DIFFERENTIAL
Absolute NRBC: 0 10*3/uL (ref 0.00–0.00)
Basophils Absolute Automated: 0.02 10*3/uL (ref 0.00–0.08)
Basophils Automated: 0.2 %
Eosinophils Absolute Automated: 0 10*3/uL (ref 0.00–0.44)
Eosinophils Automated: 0 %
Hematocrit: 40.6 % (ref 34.7–43.7)
Hgb: 14.1 g/dL (ref 11.4–14.8)
Immature Granulocytes Absolute: 0.02 10*3/uL (ref 0.00–0.07)
Immature Granulocytes: 0.2 %
Instrument Absolute Neutrophil Count: 9.53 10*3/uL — ABNORMAL HIGH (ref 1.10–6.33)
Lymphocytes Absolute Automated: 0.46 10*3/uL (ref 0.42–3.22)
Lymphocytes Automated: 4.5 %
MCH: 32.2 pg (ref 25.1–33.5)
MCHC: 34.7 g/dL (ref 31.5–35.8)
MCV: 92.7 fL (ref 78.0–96.0)
MPV: 8.8 fL — ABNORMAL LOW (ref 8.9–12.5)
Monocytes Absolute Automated: 0.21 10*3/uL (ref 0.21–0.85)
Monocytes: 2.1 %
Neutrophils Absolute: 9.53 10*3/uL — ABNORMAL HIGH (ref 1.10–6.33)
Neutrophils: 93 %
Nucleated RBC: 0 /100 WBC (ref 0.0–0.0)
Platelets: 302 10*3/uL (ref 142–346)
RBC: 4.38 10*6/uL (ref 3.90–5.10)
RDW: 12 % (ref 11–15)
WBC: 10.24 10*3/uL — ABNORMAL HIGH (ref 3.10–9.50)

## 2021-12-07 LAB — COMPREHENSIVE METABOLIC PANEL
ALT: 14 U/L (ref 0–55)
AST (SGOT): 20 U/L (ref 5–41)
Albumin/Globulin Ratio: 1.4 (ref 0.9–2.2)
Albumin: 4.4 g/dL (ref 3.5–5.0)
Alkaline Phosphatase: 56 U/L (ref 37–117)
Anion Gap: 14 (ref 5.0–15.0)
BUN: 9 mg/dL (ref 7.0–21.0)
Bilirubin, Total: 4.2 mg/dL — ABNORMAL HIGH (ref 0.2–1.2)
CO2: 21 mEq/L (ref 17–29)
Calcium: 10.1 mg/dL (ref 8.5–10.5)
Chloride: 106 mEq/L (ref 99–111)
Creatinine: 0.8 mg/dL (ref 0.4–1.0)
Globulin: 3.1 g/dL (ref 2.0–3.6)
Glucose: 175 mg/dL — ABNORMAL HIGH (ref 70–100)
Potassium: 4.2 mEq/L (ref 3.5–5.3)
Protein, Total: 7.5 g/dL (ref 6.0–8.3)
Sodium: 141 mEq/L (ref 135–145)
eGFR: >60 mL/min/{1.73_m2} (ref >=60)

## 2021-12-07 LAB — HCG QUANTITATIVE: hCG, Quant.: <2.4 m[IU]/mL

## 2021-12-07 LAB — LIPASE: Lipase: 4 U/L — ABNORMAL LOW (ref 8–78)

## 2021-12-07 MED ORDER — SODIUM CHLORIDE 0.9 % IV BOLUS
1000.0000 mL | Freq: Once | INTRAVENOUS | Status: AC
Start: 2021-12-07 — End: 2021-12-07
  Administered 2021-12-07: 1000 mL via INTRAVENOUS

## 2021-12-07 MED ORDER — FAMOTIDINE 20 MG PO TABS
20.0000 mg | ORAL_TABLET | Freq: Two times a day (BID) | ORAL | 0 refills | Status: AC
Start: 2021-12-07 — End: 2021-12-12

## 2021-12-07 MED ORDER — DICYCLOMINE HCL 10 MG PO CAPS
10.0000 mg | ORAL_CAPSULE | Freq: Once | ORAL | Status: AC
Start: 2021-12-07 — End: 2021-12-07
  Administered 2021-12-07: 10 mg via ORAL
  Filled 2021-12-07: qty 1

## 2021-12-07 MED ORDER — KETOROLAC TROMETHAMINE 15 MG/ML IJ SOLN
15.0000 mg | Freq: Once | INTRAMUSCULAR | Status: AC
Start: 2021-12-07 — End: 2021-12-07
  Administered 2021-12-07: 15 mg via INTRAVENOUS
  Filled 2021-12-07: qty 1

## 2021-12-07 MED ORDER — DICYCLOMINE HCL 10 MG PO CAPS
10.0000 mg | ORAL_CAPSULE | Freq: Four times a day (QID) | ORAL | 0 refills | Status: AC | PRN
Start: 2021-12-07 — End: 2021-12-22

## 2021-12-07 MED ORDER — IOHEXOL 350 MG/ML IV SOLN
100.0000 mL | Freq: Once | INTRAVENOUS | Status: AC | PRN
Start: 2021-12-07 — End: 2021-12-07
  Administered 2021-12-07: 100 mL via INTRAVENOUS

## 2021-12-07 MED ORDER — ONDANSETRON HCL 4 MG/2ML IJ SOLN
4.0000 mg | Freq: Once | INTRAMUSCULAR | Status: AC
Start: 2021-12-07 — End: 2021-12-07
  Administered 2021-12-07: 4 mg via INTRAVENOUS
  Filled 2021-12-07: qty 2

## 2021-12-07 MED ORDER — ONDANSETRON 4 MG PO TBDP
4.0000 mg | ORAL_TABLET | Freq: Four times a day (QID) | ORAL | 0 refills | Status: AC | PRN
Start: 2021-12-07 — End: ?

## 2021-12-07 MED ORDER — SODIUM CHLORIDE 0.9 % IV SOLN
12.5000 mg | Freq: Once | INTRAVENOUS | Status: AC
Start: 2021-12-07 — End: 2021-12-07
  Administered 2021-12-07: 12.5 mg via INTRAVENOUS
  Filled 2021-12-07: qty 1

## 2021-12-07 MED ORDER — FAMOTIDINE 10 MG/ML IV SOLN (WRAP)
20.0000 mg | Freq: Once | INTRAVENOUS | Status: AC
Start: 2021-12-07 — End: 2021-12-07
  Administered 2021-12-07: 20 mg via INTRAVENOUS
  Filled 2021-12-07: qty 2

## 2021-12-07 NOTE — Discharge Instructions (Addendum)
Please follow-up with PCP this week.  Return for any worsening abdominal pain, fevers, blood in vomit or stool, or new or worsening symptoms.  Commend brat diet (bananas, rice, apples, and toast).

## 2021-12-07 NOTE — ED Provider Notes (Signed)
EMERGENCY DEPARTMENT HISTORY AND PHYSICAL EXAM    Date: 12/07/2021   Patient Name: Kara Boyd  Attending Physician: Briant Cedar, MD   Advanced Practice Provider: Carmelina Paddock, PA    History of Presenting Illness     History Provided By: {SAHPI_1:23370}  Chief Complaint:  Chief Complaint   Patient presents with    Abdominal Pain    Diarrhea    Emesis     Kara Boyd is a 41 y.o. female presenting to the ED with ***.       PCP: Erline Hau, MD  @HXREV @    Associated symptoms and pertinent negative listed in ROS.  Review of Systems   Review of Systems  Physical Exam   Pulse (!) 58  BP 144/71  Resp 18  SpO2 100 %  Temp 98.7 F (37.1 C)    Pulse Oximetry Analysis - {Pulse ox analysis:60388} SpO2: 100 % on RA    Physical Exam  Past History     Past Medical History:  Past Medical History:   Diagnosis Date    Anxiety     Depression     Diabetes mellitus     Past Surgical History:  Past Surgical History:   Procedure Laterality Date    CESAREAN SECTION      CHOLECYSTECTOMY        Family/Social History:  She reports that she has quit smoking. She uses smokeless tobacco. She reports current alcohol use of about 2.0 standard drinks of alcohol per week. She reports current drug use. Drug: Marijuana.  History reviewed. No pertinent family history. Allergies:  Allergies   Allergen Reactions    Codeine       Listed Medications on Arrival:  Home Medications       Med List Status: In Progress Set By: Sharen Hones, RN at 12/07/2021 11:01 AM              buPROPion XL (WELLBUTRIN XL) 300 MG 24 hr tablet     Take 300 mg by mouth daily.     busPIRone (BUSPAR) 30 MG tablet     Take 30 mg by mouth 2 (two) times daily.     canagliflozin (INVOKANA) 100 MG Tab tablet     Take 100 mg by mouth daily.     citalopram (CeleXA) 20 MG tablet     Take 20 mg by mouth daily     diphenoxylate-atropine (LOMOTIL) 2.5-0.025 MG per tablet     Take 2 tablets by mouth every 6 (six) hours as needed for Diarrhea     Dulaglutide  (TRULICITY SC)     Inject into the skin.     escitalopram (LEXAPRO) 5 MG tablet     Take 5 mg by mouth daily.     famotidine (PEPCID) 40 MG tablet     Take 40 mg by mouth daily     Linagliptin (TRADJENTA PO)     Take by mouth.     LORazepam (ATIVAN) 1 MG tablet     Take 1 mg by mouth every 6 (six) hours as needed for Anxiety.     ondansetron (ZOFRAN-ODT) 4 MG disintegrating tablet     Take 1 tablet (4 mg total) by mouth every 6 (six) hours as needed for Nausea     Synjardy XR 12.09-998 MG Tablet SR 24 hr          traMADol (ULTRAM) 50 MG tablet     Take 2 tablets (100 mg total) by  mouth every 6 (six) hours as needed for Pain.     traZODone (DESYREL) 50 MG tablet          Vortioxetine HBr 20 MG Tab     Take by mouth.     Vyvanse 30 MG capsule                Primary Care Provider: Erline Hau, MD     Diagnostic Study Results   Labs -     Results       Procedure Component Value Units Date/Time    Urinalysis Reflex to Microscopic Exam- Reflex to Culture [[id number]]  (Abnormal) Collected: 12/07/21 1308     Updated: 12/07/21 1326     Urine Type Urine, Clean Ca     Color, UA Yellow     Clarity, UA Slightly Cloudy     Specific Gravity UA 1.020     Urine pH 7.5     Leukocyte Esterase, UA Negative     Nitrite, UA Negative     Protein, UR 30     Glucose, UA Negative     Ketones UA >=160     Urobilinogen, UA 0.2 mg/dL      Bilirubin, UA Negative     Blood, UA Trace-intact     RBC, UA 0-2 /hpf      WBC, UA 0-5 /hpf      Squamous Epithelial Cells, Urine 0-5 /hpf      Urine Mucus Present    Beta HCG Quantitative [[id number]] Collected: 12/07/21 1120     Updated: 12/07/21 1150     hCG, Quant. <2.4 mIU/mL     Comprehensive metabolic panel [[id number]]  (Abnormal) Collected: 12/07/21 1120    Specimen: Blood Updated: 12/07/21 1148     Glucose 175 mg/dL      BUN 9.0 mg/dL      Creatinine 0.8 mg/dL      Sodium 086 mEq/L      Potassium 4.2 mEq/L      Chloride 106 mEq/L      CO2 21 mEq/L      Calcium 10.1 mg/dL      Protein, Total 7.5 g/dL       Albumin 4.4 g/dL      AST (SGOT) 20 U/L      ALT 14 U/L      Alkaline Phosphatase 56 U/L      Bilirubin, Total 4.2 mg/dL      Globulin 3.1 g/dL      Albumin/Globulin Ratio 1.4     Anion Gap 14.0     eGFR >60.0 mL/min/1.73 m2     Lipase [[id number]]  (Abnormal) Collected: 12/07/21 1120    Specimen: Blood Updated: 12/07/21 1148     Lipase 4 U/L     CBC and differential [[id number]]  (Abnormal) Collected: 12/07/21 1120    Specimen: Blood Updated: 12/07/21 1127     WBC 10.24 x10 3/uL      Hgb 14.1 g/dL      Hematocrit 01.0 %      Platelets 302 x10 3/uL      RBC 4.38 x10 6/uL      MCV 92.7 fL      MCH 32.2 pg      MCHC 34.7 g/dL      RDW 12 %      MPV 8.8 fL      Instrument Absolute Neutrophil Count 9.53 x10 3/uL      Neutrophils 93.0 %  Lymphocytes Automated 4.5 %      Monocytes 2.1 %      Eosinophils Automated 0.0 %      Basophils Automated 0.2 %      Immature Granulocytes 0.2 %      Nucleated RBC 0.0 /100 WBC      Neutrophils Absolute 9.53 x10 3/uL      Lymphocytes Absolute Automated 0.46 x10 3/uL      Monocytes Absolute Automated 0.21 x10 3/uL      Eosinophils Absolute Automated 0.00 x10 3/uL      Basophils Absolute Automated 0.02 x10 3/uL      Immature Granulocytes Absolute 0.02 x10 3/uL      Absolute NRBC 0.00 x10 3/uL             Radiologic Studies -   Radiology Results (24 Hour)       Procedure Component Value Units Date/Time    CT Abd/ Pelvis with IV Contrast [[id number]] Collected: 12/07/21 1340    Order Status: Completed Updated: 12/07/21 1354    Narrative:      CT ABDOMEN/PELVIS W IV / WO PO CONTRAST    HISTORY: Concern for bowel obstruction and appendicitis. Nausea, vomiting, diarrhea.    COMPARISON: CT of the abdomen/pelvis from 02/26/2020.    TECHNIQUE: Axial CT images from the lower thorax to the ischial tuberosities were obtained after intravenous administration of 100 mL Omnipaque 350 iodinated contrast. Reformatted images in the coronal and sagittal planes were generated. CT scanning at   this  site utilizes multiple dose reduction techniques, including automatic exposure control, adjustment of the mA and/or kV according to patient size, and iterative reconstruction technique.       FINDINGS:      LOWER CHEST: Normal heart size. Lung bases clear.      ABDOMEN/PELVIS:    Gastrointestinal: No dilated bowel loops to suggest obstruction. The cecum is positioned more anteriorly and superiorly compared to prior CT. The appendix is somewhat difficult to visualized but suspected normal (annotated series 602 and series 601).   Dense material, presumably ingested, is present in the small bowel and colon.    Liver: Normal contour and size. Diffuse low attenuation compared to the spleen, which may reflect steatosis. Small focus of relative hyperattenuation in the ventral left lobe, which may just reflect variant perfusion.    Biliary: Cholecystectomy. Prominent central intrahepatic biliary ducts, fairly similar to prior CT. The common bile duct tapers to the ampulla.    Spleen: Normal size, contour, and enhancement.    Pancreas: Normal size, morphology, and enhancement.  No main ductal dilation. No peripancreatic stranding.    Adrenals: Normal size without discrete nodules.    Kidneys: Symmetric size and enhancement. No hydronephrosis.    Ureters: Difficult to visualize in their entirety. Visualized portions have normal course and caliber.    Bladder: Decompressed, limiting evaluation.    Reproductive: Contraceptive device in the uterine corpus/fundus; appears appropriately positioned. Otherwise unremarkable for age.    Vascular: Minimal aortic calcifications. No aneurysm.    Peritoneum/mesentery/extraperitoneum: No pneumoperitoneum. Small amount of free fluid in the pelvis. No adenopathy.      MSK: No acute fracture or destructive lesions. Polyarticular degenerative changes.        Impression:        1.Small amount of nonspecific free fluid in the pelvis. No other convincing acute findings. No evidence bowel  obstruction. Appendix is suspected to be normal.  2.Hepatic steatosis.  3.IUD in the uterus.  4.Other chronic/ancillary  findings as above.    Christy Gentles MD, MD  12/07/2021 1:52 PM          Procedures   Procedures:   Procedures  Medical Decision Making   I am the first provider for this patient. I reviewed the vital signs, available nursing notes, past medical history, past surgical history, family history and social history.  Old Medical Records: {Records review:60251}   Vital Signs-I have reviewed the patient's vital signs.   Patient Vitals for the past 12 hrs:   BP Temp Pulse Resp   12/07/21 1330 131/67 98.6 F (37 C) 61 17   12/07/21 1257 132/67 98.6 F (37 C) 65 18   12/07/21 1057 144/71 98.7 F (37.1 C) (!) 58 18       ED Course:   ED Course as of 12/07/21 1410   Sun Dec 07, 2021   1327 Urinalysis Reflex to Microscopic Exam- Reflex to Culture(!)  No evidence of UTI [CM]   1327 Glucose(!): 175 [CM]   1401 CT Abd/ Pelvis with IV Contrast    1.Small amount of nonspecific free fluid in the pelvis. No other convincing acute findings. No evidence bowel obstruction. Appendix is suspected to be normal.   2.Hepatic steatosis.   3.IUD in the uterus.   4.Other chronic/ancillary findings as above.  [CM]      ED Course User Index  [CM] Carmelina Paddock, [place]       Medications Given in the ED:  ED Medication Orders (From admission, onward)      Start Ordered     Status Ordering Provider    12/07/21 1252 12/07/21 1252  iohexol (OMNIPAQUE) 350 MG/ML injection 100 mL  IMG once as needed        Route: Intravenous  Ordered Dose: 100 mL       Last MAR action: Imaging Agent Given EL-MAGBRI, EUSSRA A    12/07/21 1248 12/07/21 1247  ketorolac (TORADOL) injection 15 mg  Once        Route: Intravenous  Ordered Dose: 15 mg       Last MAR action: Given Alleya Demeter    12/07/21 1248 12/07/21 1247  famotidine (PEPCID) injection 20 mg  Once        Route: Intravenous  Ordered Dose: 20 mg       Last MAR action: Given Latonyia Lopata,  Jennalyn Cawley    12/07/21 1248 12/07/21 1247  dicyclomine (BENTYL) capsule 10 mg  Once        Route: Oral  Ordered Dose: 10 mg       Last MAR action: Given Kaylor Simenson    12/07/21 1223 12/07/21 1222  promethazine (PHENERGAN) 12.5 mg in sodium chloride 0.9 % 50 mL IVPB  Once        Route: Intravenous  Ordered Dose: 12.5 mg       Last MAR action: Stopped Porter Moes    12/07/21 1122 12/07/21 1121  sodium chloride 0.9 % bolus 1,000 mL  Once        Route: Intravenous  Ordered Dose: 1,000 mL       Last MAR action: Stopped Novia Lansberry    12/07/21 1122 12/07/21 1121  ondansetron (ZOFRAN) injection 4 mg  Once        Route: Intravenous  Ordered Dose: 4 mg       Last MAR action: Given Kelsy Polack            Medications Prescribed:  Discharge Prescriptions    None  Provider Notes:    41 y.o. female ***      Medical Decision Making  Amount and/or Complexity of Data Reviewed  Labs: ordered. Decision-making details documented in ED Course.  Radiology: ordered. Decision-making details documented in ED Course.    Risk  Prescription drug management.         The following USACS CMT is used for risk management:     {CMTALL:59327}         Return precautions, incidental findings, appropriate follow up instructions were all reviewed with {PERSONS; PATIENT PARENTS OTHER:22927::"Patient"}. All questions answered.     ***D/w Loleta Dicker Irven Easterly, MD    Diagnosis     Clinical Impression: No diagnosis found.    Treatment Plan:   ED Disposition       None          _____________________________    CHART OWNERSHIPSigmund Hazel, PA-C, am the primary clinician of record.

## 2021-12-07 NOTE — ED Notes (Signed)
Pt states she feels better after phenergan nausea decreased vomiting resolved mild abd pain remains

## 2021-12-07 NOTE — ED Notes (Signed)
Pt had 2 episodes of emesis provider aware meds given per order

## 2023-04-25 ENCOUNTER — Emergency Department
Admission: EM | Admit: 2023-04-25 | Discharge: 2023-04-25 | Disposition: A | Discharge status: Home / Self Care | Payer: Commercial Managed Care - HMO | Attending: Emergency Medicine | Admitting: Emergency Medicine

## 2023-04-25 DIAGNOSIS — R1110 Vomiting, unspecified: Secondary | ICD-10-CM | POA: Insufficient documentation

## 2023-04-25 DIAGNOSIS — R197 Diarrhea, unspecified: Secondary | ICD-10-CM | POA: Insufficient documentation

## 2023-04-25 LAB — LAB USE ONLY - CBC WITH DIFFERENTIAL
Absolute Basophils: 0.04 10*3/uL (ref 0.00–0.08)
Absolute Eosinophils: 0.03 10*3/uL (ref 0.00–0.44)
Absolute Immature Granulocytes: 0.07 10*3/uL (ref 0.00–0.07)
Absolute Lymphocytes: 1.24 10*3/uL (ref 0.42–3.22)
Absolute Monocytes: 1.35 10*3/uL — ABNORMAL HIGH (ref 0.21–0.85)
Absolute Neutrophils: 15.65 10*3/uL — ABNORMAL HIGH (ref 1.10–6.33)
Absolute nRBC: 0 10*3/uL (ref <=0.00)
Basophils %: 0.2 %
Eosinophils %: 0.2 %
Hematocrit: 41.3 % (ref 34.7–43.7)
Hemoglobin: 14.5 g/dL (ref 11.4–14.8)
Immature Granulocytes %: 0.4 %
Lymphocytes %: 6.7 %
MCH: 31 pg (ref 25.1–33.5)
MCHC: 35.1 g/dL (ref 31.5–35.8)
MCV: 88.4 fL (ref 78.0–96.0)
MPV: 8.4 fL — ABNORMAL LOW (ref 8.9–12.5)
Monocytes %: 7.3 %
Neutrophils %: 85.2 %
Platelet Count: 395 10*3/uL — ABNORMAL HIGH (ref 142–346)
Preliminary Absolute Neutrophil Count: 15.65 10*3/uL — ABNORMAL HIGH (ref 1.10–6.33)
RBC: 4.67 10*6/uL (ref 3.90–5.10)
RDW: 11 % (ref 11–15)
WBC: 18.38 10*3/uL — ABNORMAL HIGH (ref 3.10–9.50)
nRBC %: 0 /100{WBCs} (ref <=0.0)

## 2023-04-25 LAB — COMPREHENSIVE METABOLIC PANEL
ALT: 9 U/L (ref <=55)
AST (SGOT): 23 U/L (ref <=41)
Albumin/Globulin Ratio: 1.3 (ref 0.9–2.2)
Albumin: 4.4 g/dL (ref 3.5–5.0)
Alkaline Phosphatase: 61 U/L (ref 37–117)
Anion Gap: 14 (ref 5.0–15.0)
BUN: 10 mg/dL (ref 7–21)
Bilirubin, Total: 2 mg/dL — ABNORMAL HIGH (ref 0.2–1.2)
CO2: 19 meq/L (ref 17–29)
Calcium: 10.4 mg/dL (ref 8.5–10.5)
Chloride: 107 meq/L (ref 99–111)
Creatinine: 0.8 mg/dL (ref 0.4–1.0)
GFR: >60 mL/min/{1.73_m2} (ref >=60.0)
Globulin: 3.5 g/dL (ref 2.0–3.6)
Glucose: 185 mg/dL — ABNORMAL HIGH (ref 70–100)
Potassium: 4.1 meq/L (ref 3.5–5.3)
Protein, Total: 7.9 g/dL (ref 6.0–8.3)
Sodium: 140 meq/L (ref 135–145)

## 2023-04-25 LAB — BILIRUBIN, DIRECT: Bilirubin Direct: 0.5 mg/dL (ref 0.0–0.5)

## 2023-04-25 LAB — LIPASE: Lipase: 6 U/L — ABNORMAL LOW (ref 8–78)

## 2023-04-25 MED ORDER — ONDANSETRON HCL 4 MG/2ML IJ SOLN
4.0000 mg | Freq: Once | INTRAMUSCULAR | Status: AC
Start: 2023-04-25 — End: 2023-04-25
  Administered 2023-04-25: 4 mg via INTRAVENOUS
  Filled 2023-04-25: qty 2

## 2023-04-25 MED ORDER — LOPERAMIDE HCL 2 MG PO CAPS
2.0000 mg | ORAL_CAPSULE | Freq: Four times a day (QID) | ORAL | 0 refills | Status: AC | PRN
Start: 2023-04-25 — End: ?

## 2023-04-25 MED ORDER — ONDANSETRON 4 MG PO TBDP
4.0000 mg | ORAL_TABLET | Freq: Three times a day (TID) | ORAL | 0 refills | Status: AC | PRN
Start: 2023-04-25 — End: ?

## 2023-04-25 MED ORDER — SODIUM CHLORIDE 0.9 % IV BOLUS
1000.0000 mL | Freq: Once | INTRAVENOUS | Status: DC
Start: 2023-04-25 — End: 2023-04-25

## 2023-04-25 MED ORDER — SODIUM CHLORIDE 0.9 % IV BOLUS
500.0000 mL | Freq: Once | INTRAVENOUS | Status: AC
Start: 2023-04-25 — End: 2023-04-25
  Administered 2023-04-25: 500 mL via INTRAVENOUS

## 2023-04-25 MED ORDER — PROCHLORPERAZINE EDISYLATE 10 MG/2ML IJ SOLN
10.0000 mg | Freq: Once | INTRAMUSCULAR | Status: AC
Start: 2023-04-25 — End: 2023-04-25
  Administered 2023-04-25: 10 mg via INTRAVENOUS
  Filled 2023-04-25: qty 2

## 2023-04-25 NOTE — ED Provider Notes (Signed)
 EMERGENCY DEPARTMENT HISTORY AND PHYSICAL EXAM     None      Date: 04/25/2023  Patient Name: Kara Boyd    History of Presenting Illness     History Provided By: Patient    Additional History: Kara Boyd is a 42 y.o. female with past medica

## 2023-08-20 ENCOUNTER — Emergency Department
Admission: EM | Admit: 2023-08-20 | Discharge: 2023-08-21 | Disposition: A | Discharge status: Home / Self Care | Attending: Emergency Medicine | Admitting: Emergency Medicine

## 2023-08-20 DIAGNOSIS — R112 Nausea with vomiting, unspecified: Secondary | ICD-10-CM | POA: Insufficient documentation

## 2023-08-20 DIAGNOSIS — R079 Chest pain, unspecified: Secondary | ICD-10-CM

## 2023-08-20 DIAGNOSIS — E876 Hypokalemia: Secondary | ICD-10-CM | POA: Insufficient documentation

## 2023-08-20 DIAGNOSIS — R197 Diarrhea, unspecified: Secondary | ICD-10-CM | POA: Insufficient documentation

## 2023-08-20 LAB — COMPREHENSIVE METABOLIC PANEL
ALT: 17 U/L (ref <=55)
AST (SGOT): 23 U/L (ref <=41)
Albumin/Globulin Ratio: 1.2 (ref 0.9–2.2)
Albumin: 4.6 g/dL (ref 3.5–5.0)
Alkaline Phosphatase: 58 U/L (ref 37–117)
Anion Gap: 7 (ref 5.0–15.0)
BUN: 9 mg/dL (ref 7–21)
Bilirubin, Total: 1.7 mg/dL — ABNORMAL HIGH (ref 0.2–1.2)
CO2: 28 meq/L (ref 17–29)
Calcium: 10.1 mg/dL (ref 8.5–10.5)
Chloride: 98 meq/L — ABNORMAL LOW (ref 99–111)
Creatinine: 0.9 mg/dL (ref 0.4–1.0)
GFR: >60 mL/min/{1.73_m2} (ref >=60.0)
Globulin: 4 g/dL — ABNORMAL HIGH (ref 2.0–3.6)
Glucose: 103 mg/dL — ABNORMAL HIGH (ref 70–100)
Potassium: 2.7 meq/L — CL (ref 3.5–5.3)
Protein, Total: 8.6 g/dL — ABNORMAL HIGH (ref 6.0–8.3)
Sodium: 133 meq/L — ABNORMAL LOW (ref 135–145)

## 2023-08-20 LAB — URINALYSIS WITH REFLEX TO MICROSCOPIC EXAM - REFLEX TO CULTURE
Urine Bilirubin: NEGATIVE
Urine Glucose: NEGATIVE
Urine Ketones: NEGATIVE mg/dL
Urine Leukocyte Esterase: NEGATIVE
Urine Nitrite: NEGATIVE
Urine Protein: NEGATIVE
Urine Specific Gravity: 1.005 (ref 1.001–1.035)
Urine Urobilinogen: NORMAL mg/dL (ref 0.2–2.0)
Urine pH: 7 (ref 5.0–8.0)

## 2023-08-20 LAB — LAB USE ONLY - CBC WITH DIFFERENTIAL
Absolute Basophils: 0.04 10*3/uL (ref 0.00–0.08)
Absolute Eosinophils: 0.06 10*3/uL (ref 0.00–0.44)
Absolute Immature Granulocytes: 0.03 10*3/uL (ref 0.00–0.07)
Absolute Lymphocytes: 3.83 10*3/uL — ABNORMAL HIGH (ref 0.42–3.22)
Absolute Monocytes: 1.07 10*3/uL — ABNORMAL HIGH (ref 0.21–0.85)
Absolute Neutrophils: 5.97 10*3/uL (ref 1.10–6.33)
Absolute nRBC: 0 10*3/uL (ref <=0.00)
Basophils %: 0.4 %
Eosinophils %: 0.5 %
Hematocrit: 43.8 % — ABNORMAL HIGH (ref 34.7–43.7)
Hemoglobin: 15.3 g/dL — ABNORMAL HIGH (ref 11.4–14.8)
Immature Granulocytes %: 0.3 %
Lymphocytes %: 34.8 %
MCH: 30.5 pg (ref 25.1–33.5)
MCHC: 34.9 g/dL (ref 31.5–35.8)
MCV: 87.4 fL (ref 78.0–96.0)
MPV: 8.3 fL — ABNORMAL LOW (ref 8.9–12.5)
Monocytes %: 9.7 %
Neutrophils %: 54.3 %
Platelet Count: 434 10*3/uL — ABNORMAL HIGH (ref 142–346)
Preliminary Absolute Neutrophil Count: 5.97 10*3/uL (ref 1.10–6.33)
RBC: 5.01 10*6/uL (ref 3.90–5.10)
RDW: 12 % (ref 11–15)
WBC: 11 10*3/uL — ABNORMAL HIGH (ref 3.10–9.50)
nRBC %: 0 /100{WBCs} (ref <=0.0)

## 2023-08-20 LAB — LIPASE: Lipase: 33 U/L (ref 8–78)

## 2023-08-20 LAB — MAGNESIUM: Magnesium: 1.9 mg/dL (ref 1.6–2.6)

## 2023-08-20 LAB — BASIC METABOLIC PANEL
Anion Gap: 7 (ref 5.0–15.0)
BUN: 8 mg/dL (ref 7–21)
CO2: 25 meq/L (ref 17–29)
Calcium: 9.1 mg/dL (ref 8.5–10.5)
Chloride: 103 meq/L (ref 99–111)
Creatinine: 0.8 mg/dL (ref 0.4–1.0)
GFR: >60 mL/min/{1.73_m2} (ref >=60.0)
Glucose: 96 mg/dL (ref 70–100)
Potassium: 2.8 meq/L — CL (ref 3.5–5.3)
Sodium: 135 meq/L (ref 135–145)

## 2023-08-20 LAB — BETA HCG QUANTITATIVE, PREGNANCY: hCG, Quantitative: <2.4 m[IU]/mL

## 2023-08-20 LAB — POCT PREGNANCY TEST, URINE HCG: POCT Pregnancy HCG Test, UR: NEGATIVE

## 2023-08-20 MED ORDER — PROMETHAZINE HCL 25 MG PO TABS
25.0000 mg | ORAL_TABLET | Freq: Four times a day (QID) | ORAL | 0 refills | Status: AC | PRN
Start: 2023-08-20 — End: ?

## 2023-08-20 MED ORDER — SODIUM CHLORIDE 0.9 % IV BOLUS
1000.0000 mL | Freq: Once | INTRAVENOUS | Status: AC
Start: 2023-08-20 — End: 2023-08-20
  Administered 2023-08-20: 1000 mL via INTRAVENOUS

## 2023-08-20 MED ORDER — POTASSIUM CHLORIDE CRYS ER 20 MEQ PO TBCR
60.0000 meq | EXTENDED_RELEASE_TABLET | Freq: Once | ORAL | Status: AC
Start: 2023-08-20 — End: 2023-08-20
  Administered 2023-08-20: 60 meq via ORAL
  Filled 2023-08-20: qty 3

## 2023-08-20 MED ORDER — METOCLOPRAMIDE HCL 5 MG/ML IJ SOLN
10.0000 mg | Freq: Once | INTRAMUSCULAR | Status: AC
Start: 2023-08-20 — End: 2023-08-20
  Administered 2023-08-20: 10 mg via INTRAVENOUS
  Filled 2023-08-20: qty 2

## 2023-08-20 MED ORDER — ONDANSETRON HCL 4 MG/2ML IJ SOLN
4.0000 mg | Freq: Once | INTRAMUSCULAR | Status: DC
Start: 2023-08-20 — End: 2023-08-20
  Filled 2023-08-20: qty 2

## 2023-08-20 MED ORDER — PROMETHAZINE HCL 25 MG PO TABS
25.0000 mg | ORAL_TABLET | Freq: Once | ORAL | Status: AC
Start: 2023-08-20 — End: 2023-08-20
  Administered 2023-08-20: 25 mg via ORAL
  Filled 2023-08-20: qty 1

## 2023-08-20 MED ORDER — ONDANSETRON HCL 4 MG/2ML IJ SOLN
8.0000 mg | Freq: Once | INTRAMUSCULAR | Status: AC
Start: 2023-08-20 — End: 2023-08-20
  Administered 2023-08-20: 8 mg via INTRAVENOUS
  Filled 2023-08-20: qty 2

## 2023-08-20 MED ORDER — DICYCLOMINE HCL 10 MG PO CAPS
10.0000 mg | ORAL_CAPSULE | Freq: Once | ORAL | Status: AC
Start: 2023-08-20 — End: 2023-08-20
  Administered 2023-08-20: 10 mg via ORAL
  Filled 2023-08-20: qty 1

## 2023-08-20 MED ORDER — DICYCLOMINE HCL 10 MG PO CAPS
10.0000 mg | ORAL_CAPSULE | Freq: Four times a day (QID) | ORAL | 0 refills | Status: AC | PRN
Start: 2023-08-20 — End: 2023-09-04

## 2023-08-20 MED ORDER — DIPHENHYDRAMINE HCL 50 MG/ML IJ SOLN
25.0000 mg | Freq: Once | INTRAMUSCULAR | Status: AC
Start: 2023-08-20 — End: 2023-08-20
  Administered 2023-08-20: 25 mg via INTRAVENOUS
  Filled 2023-08-20: qty 1

## 2023-08-20 NOTE — ED Triage Notes (Signed)
 Pt presents with nausea, emesis, and diarrhea since Tuesday. Pt states she has not been able to tolerate anything PO. Pt endorses chills, unsure of fevers. Pt endorses mild SOB. Pt denies chest pain, abdominal pain, or blood in vomit / stool. Pt aaox4, speaking in full sentences. Of note, pt family member states she has been under a lot of stress and getting sick more lately.

## 2023-08-20 NOTE — Discharge Instructions (Addendum)
 Dear Kara Boyd:    Before leaving the emergency department, please check with registration to make sure we have an up-to-date telephone number for you.  Certain laboratory and radiology tests do not result during your visit.  We will contact you by telephone if any of these tests are abnormal.      If you do not continue to improve or if your condition worsens, contact your primary doctor or return to the emergency department for a re-evaluation.      Take these papers to all of your follow-up appointments.  Enclosed you will find your test results from today's visit.     Thank you for choosing the [facility] Emergency Department for your healthcare needs.     Delorse Lek, MD, MPH  Attending Physician  Department of Emergency Medicine

## 2023-08-20 NOTE — ED Provider Notes (Signed)
 [place] [facility] EMERGENCY DEPARTMENT ATTENDING PHYSICIAN NOTE     CLINICAL SUMMARY          Diagnosis:    .     Final diagnoses:   Vomiting and diarrhea   Hypokalemia         Disposition:       ED Disposition       ED Disposition   Discharge    Condition   --    Date/Time   Sat Aug 21, 2023 12:11 AM    Comment   Marcell Anger [place] discharge to home/self care.    Condition at disposition: Stable                    Discharge Prescriptions       Medication Sig Dispense Auth. Provider    promethazine (PHENERGAN) 25 MG tablet Take 1 tablet (25 mg) by mouth every 6 (six) hours as needed for Nausea 10 tablet Delorse Lek, MD    dicyclomine (BENTYL) 10 MG capsule Take 1 capsule (10 mg) by mouth every 6 (six) hours as needed (abdominal cramps) 15 capsule Delorse Lek, MD    potassium chloride (KLOR-CON) 20 MEQ packet Take 20 mEq by mouth 2 (two) times daily for 4 days 8 packet Delorse Lek, MD                                              CLINICAL INFORMATION        HPI:         43 y.o. female p/w vomiting and diarrhea  Non-bloody  Ongoing for a few days  No abd pain  Mother had same symptoms a few days ago  Hx of cholecystectomy and c-section    History obtained from:  Patient, mother          ROS:      Pertinent Positives and Negatives noted in the HPI.    All other systems reviewed and are negative        Physical Exam:      Pulse 90  BP 134/84  Resp 20  SpO2 93 %  Temp 99 F (37.2 C)    Nursing note and vitals reviewed.    Constitutional:  dry. afebrile. Full sentences.   Psych:  Normal affect. Cooperative.   Eyes: No conjunctival injection. No discharge.  Ears, Nose, Mouth and Throat:Tolerating secretions.  No hoarseness.  Respiratory: No respiratory distress. No retractions.   Cardiovascular:    Abdomen: Soft. Non-tender    Musculoskeletal:           Neck:           Back:            Upper Extremity:           Lower Extremity:   Neurological:   Integumentary:   GU:   Lymphatic:                PAST HISTORY         Primary Care Provider: Sherran Needs, MD        PMH/PSH:      Rocky Morel has a past medical history of Anxiety, Depression, and Diabetes mellitus (CMS/HCC).  She has a past surgical history that includes Cholecystectomy and Cesarean section.      Social/Family History:      She  reports that she has quit smoking. She uses smokeless tobacco. She reports that she does not currently use alcohol after a past usage of about 2.0 standard drinks of alcohol per week. She reports current drug use. Drug: Marijuana.  Her family history is not on file.      Listed Medications on Arrival:    .     Home Medications       Med List Status: In Progress Set By: Iris Pert, RN at 08/20/2023  8:18 PM              buPROPion XL (WELLBUTRIN XL) 300 MG 24 hr tablet     Take 150 mg by mouth daily     busPIRone (BUSPAR) 30 MG tablet     Take 30 mg by mouth 2 (two) times daily.     canagliflozin (INVOKANA) 100 MG Tab tablet     Take 100 mg by mouth daily.     citalopram (CeleXA) 20 MG tablet     Take 20 mg by mouth daily     LORazepam (ATIVAN) 1 MG tablet     Take 1 mg by mouth every 6 (six) hours as needed for Anxiety.     Mounjaro 2.5 MG/0.5ML injection          Vortioxetine HBr 20 MG Tab     Take by mouth.     Vyvanse 30 MG capsule               Flagged for Removal               diphenoxylate-atropine (LOMOTIL) 2.5-0.025 MG per tablet     Take 2 tablets by mouth every 6 (six) hours as needed for Diarrhea     Dulaglutide (TRULICITY SC)     Inject into the skin.     escitalopram (LEXAPRO) 5 MG tablet     Take 5 mg by mouth daily.     famotidine (PEPCID) 40 MG tablet     Take 40 mg by mouth daily     Linagliptin (TRADJENTA PO)     Take by mouth.     loperamide (IMODIUM) 2 MG capsule     Take 1 capsule (2 mg) by mouth 4 (four) times daily as needed for Diarrhea     ondansetron (ZOFRAN-ODT) 4 MG disintegrating tablet     Take 1 tablet (4 mg total) by mouth every 6 (six) hours as needed for Nausea     ondansetron  (ZOFRAN-ODT) 4 MG disintegrating tablet     Take 1 tablet (4 mg) by mouth every 6 (six) hours as needed for Nausea     ondansetron (ZOFRAN-ODT) 4 MG disintegrating tablet     Take 1 tablet (4 mg) by mouth every 8 (eight) hours as needed for Nausea     Synjardy XR 12.09-998 MG Tablet SR 24 hr          traMADol (ULTRAM) 50 MG tablet     Take 2 tablets (100 mg total) by mouth every 6 (six) hours as needed for Pain.     traZODone (DESYREL) 50 MG tablet               Allergies: She is allergic to codeine.            VISIT INFORMATION        Medical Decision Making & Clinical Course:      I reviewed the vital signs, nursing notes, past medical history, past surgical history,  family history and social history.    At 8:26 PM:  43 y.o. female p/w multiple episodes of non-bloody vomiting and diarrhea for the past few days.  Sick contact in mother as detailed in HPI.  In ER, pt is afebrile w/ nl hemodynamics.  Looks a bit dry but not toxic. Abdomen is soft and benign.  Will check labs. IV fluids and anti-emetics ordered. Re-eval.     At 9:13 PM:  Hypokalemic to 2.7  No hypokalemic EKG changes  Will replace K and repeat it in a while      At 12:28 AM:  K now 3.1  Pt feels markedly better  No abdominal pain  Soft benign abdomen  Will d/c as a viral process w/ some potassium supplement for the next few days and PRN anti-emetics  ER return instructions given  Pt and mother a/w plan.             Medical Decision Making  Problems Addressed:  Hypokalemia: acute illness or injury that poses a threat to life or bodily functions  Vomiting and diarrhea: acute illness or injury that poses a threat to life or bodily functions    Amount and/or Complexity of Data Reviewed  Labs: ordered. Decision-making details documented in ED Course.  ECG/medicine tests: ordered.    Risk  Prescription drug management.          Medications Given in the ED:    .     ED Medication Orders (From admission, onward)      Start Ordered     Status Ordering Provider     08/20/23 2259 08/20/23 2258  metoclopramide (REGLAN) injection 10 mg  Once        Route: Intravenous  Ordered Dose: 10 mg       Last MAR action: Given Skyelyn Scruggs    08/20/23 2259 08/20/23 2258  diphenhydrAMINE (BENADRYL) injection 25 mg  Once        Route: Intravenous  Ordered Dose: 25 mg       Last MAR action: Given Katalena Malveaux    08/20/23 2251 08/20/23 2250  potassium chloride (KLOR-CON M20) CR tablet 60 mEq  Once        Route: Oral  Ordered Dose: 60 mEq       Last MAR action: Given Rashaun Wichert    08/20/23 2043 08/20/23 2042  potassium chloride (KLOR-CON M20) CR tablet 60 mEq  Once        Route: Oral  Ordered Dose: 60 mEq       Last MAR action: Given Declynn Lopresti    08/20/23 2037 08/20/23 2036  ondansetron (ZOFRAN) injection 8 mg  Once        Route: Intravenous  Ordered Dose: 8 mg       Last MAR action: Given Quavion Boule    08/20/23 2037 08/20/23 2036  promethazine (PHENERGAN) tablet 25 mg  Once        Route: Oral  Ordered Dose: 25 mg       Last MAR action: Given Keo Schirmer    08/20/23 2037 08/20/23 2036  dicyclomine (BENTYL) capsule 10 mg  Once        Route: Oral  Ordered Dose: 10 mg       Last MAR action: Given Guss Farruggia    08/20/23 1827 08/20/23 1826  sodium chloride 0.9 % bolus 1,000 mL  Once        Route: Intravenous  Ordered Dose: 1,000 mL  Last MAR action: Stopped [facility], AMAN A    08/20/23 1827 08/20/23 1826    Once        Route: Intravenous  Ordered Dose: 4 mg       Discontinued SHAH, AMAN A              Procedures:      Procedures      Interpretations:      At 9:13 PM:  EKG independently reviewed and interpreted by me   Overall Impression: sinus, nl rate, no st changes, nl qtc                RESULTS        Lab Results:      Results       Procedure Component Value Units Date/Time    Basic Metabolic Panel [[id number]]  (Abnormal) Collected: 08/20/23 2327    Specimen: Blood, Venous Updated: 08/21/23 0007     Glucose 97 mg/dL      BUN 8 mg/dL      Creatinine 0.8 mg/dL      Calcium 9.2 mg/dL       Sodium 696 mEq/L      Potassium 3.1 mEq/L      Chloride 101 mEq/L      CO2 23 mEq/L      Anion Gap 11.0     GFR >60.0 mL/min/1.73 m2     Magnesium [[id number]]  (Normal) Collected: 08/20/23 2139    Specimen: Blood, Venous Updated: 08/20/23 2320     Magnesium 1.9 mg/dL     Basic Metabolic Panel [[id number]]  (Abnormal) Collected: 08/20/23 2139    Specimen: Blood, Venous Updated: 08/20/23 2249     Glucose 96 mg/dL      BUN 8 mg/dL      Creatinine 0.8 mg/dL      Calcium 9.1 mg/dL      Sodium 644 mEq/L      Potassium 2.8 mEq/L      Chloride 103 mEq/L      CO2 25 mEq/L      Anion Gap 7.0     GFR >60.0 mL/min/1.73 m2     Urinalysis with Reflex to Microscopic Exam and Culture [[id number]]  (Abnormal) Collected: 08/20/23 2103    Specimen: Urine, Clean Catch Updated: 08/20/23 2131     Urine Color Colorless     Urine Clarity Hazy     Urine Specific Gravity 1.005     Urine pH 7.0     Urine Leukocyte Esterase Negative     Urine Nitrite Negative     Urine Protein Negative     Urine Glucose Negative     Urine Ketones Negative mg/dL      Urine Urobilinogen Normal mg/dL      Urine Bilirubin Negative     Urine Blood Trace     RBC, UA 0-2 /hpf      Urine WBC 0-5 /hpf      Urine Squamous Epithelial Cells 0-5 /hpf     Beta HCG Quantitative, Pregnancy [[id number]] Collected: 08/20/23 2008    Specimen: Blood, Venous Updated: 08/20/23 2120     hCG, Quantitative <2.4 mIU/mL     Narrative:      PREGNANCY TEST INTERPRETATION  Negative for pregnancy         <5.0 mIU/mL  Indeterminate for pregnancy   5.0 - 24.9 mIU/mL - Retest after  48 hours (recommended) or retest                                 by alternate methodology.  Positive for pregnancy        >=25.0 mIU/mL      APPROXIMATE GESTATIONAL AGE   APPROXIMATE HCG mIU/mL  0-1 WEEKS                          <50      mIU/mL  1-2 WEEKS                        40-300     mIU/mL  2-3 WEEKS                       [phone]    mIU/mL  3-4 WEEKS                        [phone]    mIU/mL  4-8 WEEKS                     5,000-200,000 mIU/mL   8-12 WEEKS                   10,000-100,000 mIU/mL  2ND TRIMESTER                 3,000-50,000  mIU/mL  3RD TRIMESTER                 1,000-50,000  mIU/mL    This total Beta-hCG assay is approved for the testing  of pregnancy ONLY. It is not approved for any other  uses such as tumor marker screening or tumor marker  monitoring.    Rarely, some healthy, non-pregnant women may have higher  levels of BHCG.    Post menopausal women may elicit weakly positive results due  to low BHCG levels unrelated to pregnancy.    Some rare types of tumors may produce low levels BHCG.    Ectopic pregnancy and early natural termination cannot  be distinguished from early normal pregnancy by BHCG level  alone.    Urine [employer] Tube [[id number]] Collected: 08/20/23 2103    Specimen: Urine, Clean Catch Updated: 08/20/23 2119    POCT Pregnancy Test, Urine HCG [[id number]] Collected: 08/20/23 2104    Specimen: Urine Updated: 08/20/23 2105     POCT QC Pass     POCT Pregnancy HCG Test, UR Negative     Comment: Negative Value is Normal in Healthy Males or Healthy non-pregnant Females    Comprehensive Metabolic Panel [[id number]]  (Abnormal) Collected: 08/20/23 2008    Specimen: Blood, Venous Updated: 08/20/23 2041     Glucose 103 mg/dL      BUN 9 mg/dL      Creatinine 0.9 mg/dL      Sodium 952 mEq/L      Potassium 2.7 mEq/L      Chloride 98 mEq/L      CO2 28 mEq/L      Calcium 10.1 mg/dL      Anion Gap 7.0     GFR >60.0 mL/min/1.73 m2      AST (SGOT) 23 U/L      ALT 17 U/L      Alkaline Phosphatase 58 U/L      Albumin 4.6  g/dL      Protein, Total 8.6 g/dL      Globulin 4.0 g/dL      Albumin/Globulin Ratio 1.2     Bilirubin, Total 1.7 mg/dL     Lipase [[id number]]  (Normal) Collected: 08/20/23 2008    Specimen: Blood, Venous Updated: 08/20/23 2039     Lipase 33 U/L     CBC with Differential (Order) [[id number]]  (Abnormal) Collected: 08/20/23 2008    Specimen:  Blood, Venous Updated: 08/20/23 2020    Narrative:      The following orders were created for panel order CBC with Differential (Order).  Procedure                               Abnormality         Status                     ---------                               -----------         ------                     CBC with Differential (...[[id number]]  Abnormal            Final result                 Please view results for these tests on the individual orders.    CBC with Differential (Component) [[id number]]  (Abnormal) Collected: 08/20/23 2008    Specimen: Blood, Venous Updated: 08/20/23 2020     WBC 11.00 x10 3/uL      Hemoglobin 15.3 g/dL      Hematocrit 03.4 %      Platelet Count 434 x10 3/uL      MPV 8.3 fL      RBC 5.01 x10 6/uL      MCV 87.4 fL      MCH 30.5 pg      MCHC 34.9 g/dL      RDW 12 %      nRBC % 0.0 /100 WBC      Absolute nRBC 0.00 x10 3/uL      Preliminary Absolute Neutrophil Count 5.97 x10 3/uL      Neutrophils % 54.3 %      Lymphocytes % 34.8 %      Monocytes % 9.7 %      Eosinophils % 0.5 %      Basophils % 0.4 %      Immature Granulocytes % 0.3 %      Absolute Neutrophils 5.97 x10 3/uL      Absolute Lymphocytes 3.83 x10 3/uL      Absolute Monocytes 1.07 x10 3/uL      Absolute Eosinophils 0.06 x10 3/uL      Absolute Basophils 0.04 x10 3/uL      Absolute Immature Granulocytes 0.03 x10 3/uL                 Radiology Results:      No orders to display               Scribe Attestation:      I was acting as a Neurosurgeon for Delorse Lek, MD on Anselm Lis    I am the first provider for  this patient and I personally performed the services documented. Jacqulyn Cane is scribing for me on Kesselman,Aniqa ELYSE. This note and the patient instructions accurately reflect work and decisions made by me.  Delorse Lek, MD            Delorse Lek, MD  08/21/23 [phone]

## 2023-08-21 LAB — BASIC METABOLIC PANEL
Anion Gap: 11 (ref 5.0–15.0)
BUN: 8 mg/dL (ref 7–21)
CO2: 23 meq/L (ref 17–29)
Calcium: 9.2 mg/dL (ref 8.5–10.5)
Chloride: 101 meq/L (ref 99–111)
Creatinine: 0.8 mg/dL (ref 0.4–1.0)
GFR: >60 mL/min/{1.73_m2} (ref >=60.0)
Glucose: 97 mg/dL (ref 70–100)
Potassium: 3.1 meq/L — ABNORMAL LOW (ref 3.5–5.3)
Sodium: 135 meq/L (ref 135–145)

## 2023-08-21 LAB — LAB USE ONLY - URINE GRAY CULTURE HOLD TUBE

## 2023-08-21 MED ORDER — POTASSIUM CHLORIDE 20 MEQ PO PACK
20.0000 meq | PACK | Freq: Two times a day (BID) | ORAL | 0 refills | Status: AC
Start: 2023-08-21 — End: 2023-08-25

## 2023-08-22 LAB — ECG 12-LEAD
Atrial Rate: 57 {beats}/min
P Axis: 51 degrees
P-R Interval: 136 ms
Q-T Interval: 448 ms
QRS Duration: 90 ms
QTC Calculation (Bezet): 436 ms
R Axis: 11 degrees
T Axis: 36 degrees
Ventricular Rate: 57 {beats}/min

## 2024-02-03 ENCOUNTER — Ambulatory Visit (INDEPENDENT_AMBULATORY_CARE_PROVIDER_SITE_OTHER)

## 2024-02-03 ENCOUNTER — Encounter (INDEPENDENT_AMBULATORY_CARE_PROVIDER_SITE_OTHER): Payer: Self-pay

## 2024-02-03 VITALS — BP 130/84 | HR 74 | Temp 98.5°F | Resp 20 | Ht 68.0 in | Wt 209.0 lb

## 2024-02-03 DIAGNOSIS — M7752 Other enthesopathy of left foot: Secondary | ICD-10-CM

## 2024-02-03 MED ORDER — MELOXICAM 7.5 MG PO TABS
7.5000 mg | ORAL_TABLET | Freq: Every day | ORAL | 0 refills | Status: AC
Start: 2024-02-03 — End: 2024-02-10

## 2024-02-03 NOTE — Progress Notes (Signed)
 [place] [facility]  OFFICE NOTE         Subjective   Historian: Patient      Chief Complaint   Patient presents with    Ankle Pain     Pt c/o burning sensation/pain on right ankle x3 days.         History of Present Illness  Kara Boyd is a 43 year old female who presents with new onset ankle pain.    She experiences a burning sensation in her ankle for the past three days, worsening with movement, especially when standing. The pain radiates upwards and is severe enough to cause instability, preventing her from standing on it for about thirty minutes after onset. There is no swelling, and she denies recent trauma or falls.    Her medical history includes a shortened bone and a bunion on the affected foot. It is unclear if the bunion is related to the bone abnormality. She has chronic lower back pain and degenerative disc disease, for which she receives injections. Due to insurance changes, she has not seen her regular doctors.    Current medications include Wellbutrin, Buspar, Vyvanse, Celexa, and bupropion. She has not been taking Invokana, lorazepam, Phenergan , or tramadol  recently. She is allergic to codeine, which causes nausea.    History:  Medications and Allergies reviewed.   Pertinent Past Medical, Surgical, Family and Social History were reviewed.          Objective     Vitals:    02/03/24 1424   BP: 130/84   BP Site: Right arm   Patient Position: Sitting   Cuff Size: Large   Pulse: 74   Resp: 20   Temp: 98.5 F (36.9 C)   TempSrc: Tympanic   SpO2: 98%   Weight: 94.8 kg (209 lb)   Height: 1.727 m (5' 8)     Physical Exam   Physical Exam  MEASUREMENTS: Height- 5'8, Weight- 209.  GENERAL: Alert, cooperative, well developed, no acute distress  HEENT: Normocephalic, normal oropharynx, moist mucous membranes  CHEST: Clear to auscultation bilaterally, no wheezes, rhonchi, or crackles  CARDIOVASCULAR: Normal heart rate and rhythm, S1 and S2 normal without murmurs  ABDOMEN: Soft,  non-tender, non-distended, without organomegaly, normal bowel sounds  EXTREMITIES: No redness, swelling, deformities noted to right ankle.  Pedal pulse 2+.  Capillary refill brisk.  No bony tenderness  NEUROLOGICAL: Cranial nerves grossly intact, moves all extremities without gross motor or sensory deficit  Urgent Care Course   There were no labs reviewed with this patient during the visit.    There were no x-rays reviewed with this patient during the visit.      Procedures   Procedures     Assessment / Plan     Differential Diagnoses including but not limited to: strain, sprain, fracture, dislocation, nv injury, compartment syndrome, ligament tear, tendonitis, tendon tear/rupture,     Assessment & Plan  Right ankle tendinitis  Presents with a 3-day history of burning pain in the right ankle, exacerbated by movement and weight shifting. No history of trauma or swelling. Pain radiates from the ankle upwards, causing difficulty standing. A shortened bone in the foot and bunion are present but not contributing to the ankle pain. Clinical presentation is consistent with tendinitis, likely due to an aggravated tendon. No tenderness in the bones. She is using recommended footwear and taking Tylenol  and Aleve for pain relief.  - Prescribe Mobic  for anti-inflammatory treatment.  - Advise against using a  brace to avoid restricting movement.  - Recommend rest, ice, and elevation of the ankle.  - Instruct to avoid using other NSAIDs like Naprosyn to prevent stomach irritation.  - Advise follow-up with podiatrist for potential heel inserts or orthotics.       Kara Boyd was seen today for ankle pain.    Diagnoses and all orders for this visit:    Left ankle tendonitis  -     meloxicam  (MOBIC ) 7.5 MG tablet; Take 1 tablet (7.5 mg) by mouth once daily for 7 days         The indications for early follow-up with PCP and return to UC were discussed. Patient/family received education on the working diagnosis, diagnostic uncertainties,  and proposed treatment plan. Indications for emergency evaluation in the ED were reviewed. Written and verbal discharge instructions were provided and discussed and all questions from the patient/family were addressed, with no apparent barriers.      Verbal consent obtained to record this visit.

## 2024-02-03 NOTE — Patient Instructions (Addendum)
 Do not participate in any strenuous activity or sports for the next 2 to 3 days.  You must rest your injury while it is healing.    Please use cold packs (bag of frozen peas or corn work well) for pain and/or swelling.  If you use ice, always place a towel between the ice and your skin (to avoid frostbite) or use an ice bag, apply ice for 20 to 30 minutes at a time, with at least 20 minutes between applications.  For heat please use the shower or tub.  Do not apply heating pad directly to the injury for several days.  You may use a sports ointment such as IcyHot or Bengay according to the directions.    Keep injured part above your heart for the next 2 to 3 days whenever you are sitting or lying down.  Do this a minimum of 30 minutes several times a day for the next 2 to 3 days.    Follow-up with your podiatrist as discussed during your visit
